# Patient Record
Sex: Male | Born: 1940 | Race: White | Hispanic: No | Smoking: Current every day smoker
Health system: Southern US, Community
[De-identification: ages and names within clinical notes are randomized; demographics above are authoritative.]

## PROBLEM LIST (undated history)

## (undated) DIAGNOSIS — F1010 Alcohol abuse, uncomplicated: Secondary | ICD-10-CM

## (undated) DIAGNOSIS — J61 Pneumoconiosis due to asbestos and other mineral fibers: Secondary | ICD-10-CM

## (undated) DIAGNOSIS — I2510 Atherosclerotic heart disease of native coronary artery without angina pectoris: Secondary | ICD-10-CM

## (undated) DIAGNOSIS — E119 Type 2 diabetes mellitus without complications: Secondary | ICD-10-CM

## (undated) DIAGNOSIS — E785 Hyperlipidemia, unspecified: Secondary | ICD-10-CM

## (undated) DIAGNOSIS — Z720 Tobacco use: Secondary | ICD-10-CM

## (undated) DIAGNOSIS — K219 Gastro-esophageal reflux disease without esophagitis: Secondary | ICD-10-CM

## (undated) DIAGNOSIS — I10 Essential (primary) hypertension: Secondary | ICD-10-CM

## (undated) DIAGNOSIS — I639 Cerebral infarction, unspecified: Secondary | ICD-10-CM

## (undated) HISTORY — PX: OTHER SURGICAL HISTORY: SHX169

## (undated) HISTORY — DX: Essential (primary) hypertension: I10

## (undated) HISTORY — DX: Atherosclerotic heart disease of native coronary artery without angina pectoris: I25.10

## (undated) HISTORY — DX: Type 2 diabetes mellitus without complications: E11.9

## (undated) HISTORY — DX: Gastro-esophageal reflux disease without esophagitis: K21.9

## (undated) HISTORY — PX: APPENDECTOMY: SHX54

## (undated) HISTORY — DX: Tobacco use: Z72.0

---

## 1999-08-07 ENCOUNTER — Encounter: Admission: RE | Admit: 1999-08-07 | Discharge: 1999-08-07 | Payer: Self-pay | Admitting: Cardiology

## 1999-08-07 ENCOUNTER — Encounter: Payer: Self-pay | Admitting: Cardiology

## 1999-09-04 ENCOUNTER — Encounter: Payer: Self-pay | Admitting: Cardiology

## 1999-09-04 ENCOUNTER — Ambulatory Visit (HOSPITAL_COMMUNITY): Admission: RE | Admit: 1999-09-04 | Discharge: 1999-09-04 | Payer: Self-pay | Admitting: *Deleted

## 2000-04-25 ENCOUNTER — Observation Stay (HOSPITAL_COMMUNITY): Admission: RE | Admit: 2000-04-25 | Discharge: 2000-04-26 | Payer: Self-pay | Admitting: Cardiology

## 2000-10-23 ENCOUNTER — Emergency Department (HOSPITAL_COMMUNITY): Admission: EM | Admit: 2000-10-23 | Discharge: 2000-10-23 | Payer: Self-pay | Admitting: Emergency Medicine

## 2000-11-28 ENCOUNTER — Emergency Department (HOSPITAL_COMMUNITY): Admission: EM | Admit: 2000-11-28 | Discharge: 2000-11-28 | Payer: Self-pay | Admitting: *Deleted

## 2001-06-13 ENCOUNTER — Encounter: Payer: Self-pay | Admitting: Cardiology

## 2001-06-13 ENCOUNTER — Ambulatory Visit (HOSPITAL_COMMUNITY): Admission: RE | Admit: 2001-06-13 | Discharge: 2001-06-13 | Payer: Self-pay | Admitting: Cardiology

## 2001-08-18 ENCOUNTER — Emergency Department (HOSPITAL_COMMUNITY): Admission: EM | Admit: 2001-08-18 | Discharge: 2001-08-18 | Payer: Self-pay | Admitting: Emergency Medicine

## 2002-05-01 ENCOUNTER — Encounter: Payer: Self-pay | Admitting: Cardiology

## 2002-05-01 ENCOUNTER — Ambulatory Visit (HOSPITAL_COMMUNITY): Admission: RE | Admit: 2002-05-01 | Discharge: 2002-05-01 | Payer: Self-pay | Admitting: Cardiology

## 2011-04-15 ENCOUNTER — Encounter (HOSPITAL_COMMUNITY): Payer: Self-pay

## 2011-04-15 ENCOUNTER — Emergency Department (HOSPITAL_COMMUNITY)
Admission: EM | Admit: 2011-04-15 | Discharge: 2011-04-15 | Disposition: A | Discharge status: Home / Self Care | Payer: Medicare PPO | Attending: Emergency Medicine | Admitting: Emergency Medicine

## 2011-04-15 ENCOUNTER — Other Ambulatory Visit: Payer: Self-pay

## 2011-04-15 ENCOUNTER — Emergency Department (HOSPITAL_COMMUNITY): Payer: Medicare PPO

## 2011-04-15 DIAGNOSIS — I252 Old myocardial infarction: Secondary | ICD-10-CM | POA: Insufficient documentation

## 2011-04-15 DIAGNOSIS — Z8673 Personal history of transient ischemic attack (TIA), and cerebral infarction without residual deficits: Secondary | ICD-10-CM | POA: Insufficient documentation

## 2011-04-15 DIAGNOSIS — Z7982 Long term (current) use of aspirin: Secondary | ICD-10-CM | POA: Insufficient documentation

## 2011-04-15 DIAGNOSIS — R404 Transient alteration of awareness: Secondary | ICD-10-CM | POA: Insufficient documentation

## 2011-04-15 DIAGNOSIS — W1809XA Striking against other object with subsequent fall, initial encounter: Secondary | ICD-10-CM | POA: Insufficient documentation

## 2011-04-15 DIAGNOSIS — Y92009 Unspecified place in unspecified non-institutional (private) residence as the place of occurrence of the external cause: Secondary | ICD-10-CM | POA: Insufficient documentation

## 2011-04-15 DIAGNOSIS — F1010 Alcohol abuse, uncomplicated: Secondary | ICD-10-CM | POA: Insufficient documentation

## 2011-04-15 DIAGNOSIS — F17200 Nicotine dependence, unspecified, uncomplicated: Secondary | ICD-10-CM | POA: Insufficient documentation

## 2011-04-15 DIAGNOSIS — S2239XA Fracture of one rib, unspecified side, initial encounter for closed fracture: Secondary | ICD-10-CM

## 2011-04-15 DIAGNOSIS — E119 Type 2 diabetes mellitus without complications: Secondary | ICD-10-CM | POA: Insufficient documentation

## 2011-04-15 DIAGNOSIS — Y998 Other external cause status: Secondary | ICD-10-CM | POA: Insufficient documentation

## 2011-04-15 DIAGNOSIS — W19XXXA Unspecified fall, initial encounter: Secondary | ICD-10-CM

## 2011-04-15 DIAGNOSIS — Z79899 Other long term (current) drug therapy: Secondary | ICD-10-CM | POA: Insufficient documentation

## 2011-04-15 DIAGNOSIS — S2249XA Multiple fractures of ribs, unspecified side, initial encounter for closed fracture: Secondary | ICD-10-CM | POA: Insufficient documentation

## 2011-04-15 DIAGNOSIS — R109 Unspecified abdominal pain: Secondary | ICD-10-CM | POA: Insufficient documentation

## 2011-04-15 HISTORY — DX: Alcohol abuse, uncomplicated: F10.10

## 2011-04-15 HISTORY — DX: Cerebral infarction, unspecified: I63.9

## 2011-04-15 LAB — CBC
MCH: 29.3 pg (ref 26.0–34.0)
MCHC: 34.3 g/dL (ref 30.0–36.0)
Platelets: 205 10*3/uL (ref 150–400)

## 2011-04-15 LAB — DIFFERENTIAL
Basophils Relative: 1 % (ref 0–1)
Eosinophils Absolute: 0.2 10*3/uL (ref 0.0–0.7)
Monocytes Relative: 6 % (ref 3–12)
Neutrophils Relative %: 58 % (ref 43–77)

## 2011-04-15 LAB — COMPREHENSIVE METABOLIC PANEL
Albumin: 3.3 g/dL — ABNORMAL LOW (ref 3.5–5.2)
Alkaline Phosphatase: 82 U/L (ref 39–117)
BUN: 10 mg/dL (ref 6–23)
Calcium: 9.4 mg/dL (ref 8.4–10.5)
Potassium: 4 mEq/L (ref 3.5–5.1)
Sodium: 135 mEq/L (ref 135–145)
Total Protein: 6.5 g/dL (ref 6.0–8.3)

## 2011-04-15 LAB — ETHANOL: Alcohol, Ethyl (B): 316 mg/dL — ABNORMAL HIGH (ref 0–11)

## 2011-04-15 MED ORDER — HYDROCODONE-ACETAMINOPHEN 5-325 MG PO TABS: 1.0000 | ORAL_TABLET | Freq: Four times a day (QID) | ORAL | Status: DC | PRN

## 2011-04-15 MED ORDER — HYDROCODONE-ACETAMINOPHEN 5-325 MG PO TABS
1.0000 | ORAL_TABLET | Freq: Once | ORAL | Status: AC
  Administered 2011-04-15: 1 via ORAL
  Filled 2011-04-15: qty 1

## 2011-04-15 MED ORDER — SODIUM CHLORIDE 0.9 % IV SOLN
Freq: Once | INTRAVENOUS | Status: AC
  Administered 2011-04-15: 1000 mL via INTRAVENOUS

## 2011-04-15 NOTE — ED Provider Notes (Signed)
History   This chart was scribed for Larry Lennert, MD by Sofie Rower. The patient was seen in room APA17/APA17 and the patient's care was started at 3:25PM.    CSN: [id number]  Arrival date & time 04/15/11  1511   First MD Initiated Contact with Patient 04/15/11 1515      Chief Complaint  Patient presents with  . Alcohol Intoxication  . Fall    (Consider location/radiation/quality/duration/timing/severity/associated sxs/prior treatment) HPI Comments: The patient appears mildly lethargic and smells of ETOH.  Patient is a 71 y.o. male presenting with fall. The history is provided by the patient and the EMS personnel. The history is limited by the condition of the patient. No language interpreter was used.  Fall The accident occurred 3 to 5 hours ago. The fall occurred in unknown circumstances. He fell from an unknown height. He landed on a hard floor. There was no blood loss. Point of impact: Right side. Pain location: LUQ. Pain scale: Unknown. The pain is severe. He was not ambulatory at the scene. There was no entrapment after the fall. There was no drug use involved in the accident. There was alcohol use involved in the accident. Associated symptoms include abdominal pain (Pain upon palpitation.). Pertinent negatives include no fever, no hematuria, no headaches and no hearing loss. The symptoms are aggravated by pressure on the injury. Treatment on scene includes a c-collar. He has tried immobilization for the symptoms. The treatment provided no relief.   Pt does not have a PCP.  Past Medical History  Diagnosis Date  . Diabetes mellitus   . Stroke   . Myocardial infarct   . Alcohol abuse     No past surgical history on file.  No family history on file.  History  Substance Use Topics  . Smoking status: Current Everyday Smoker  . Smokeless tobacco: Not on file  . Alcohol Use: Yes    10 Systems reviewed and are negative for acute change except as noted in the  HPI.   Review of Systems  Constitutional: Negative for fever and fatigue.  HENT: Negative for congestion, sinus pressure and ear discharge.   Eyes: Negative for discharge.  Respiratory: Negative for cough.   Cardiovascular: Negative for chest pain.  Gastrointestinal: Positive for abdominal pain (Pain upon palpitation.). Negative for diarrhea.  Genitourinary: Negative for frequency and hematuria.  Musculoskeletal: Negative for back pain.  Skin: Negative for rash.  Neurological: Negative for seizures and headaches.  Hematological: Negative.   Psychiatric/Behavioral: Negative for hallucinations.  All other systems reviewed and are negative.    Allergies  Review of patient's allergies indicates not on file.  Home Medications   Current Outpatient Rx  Name Route Sig Dispense Refill  . ASPIRIN EC 325 MG PO TBEC Oral Take 325 mg by mouth daily.    . IBUPROFEN 200 MG PO TABS Oral Take 200 mg by mouth every 6 (six) hours as needed. pain    . METFORMIN HCL 500 MG PO TABS Oral Take 500 mg by mouth 2 (two) times daily with a meal.    . METOPROLOL TARTRATE 100 MG PO TABS Oral Take 100 mg by mouth 2 (two) times daily.    Marland Kitchen OMEPRAZOLE 20 MG PO CPDR Oral Take 20 mg by mouth daily.    Marland Kitchen RABEPRAZOLE SODIUM 20 MG PO TBEC Oral Take 20 mg by mouth daily.      BP 157/76  Pulse 76  Temp(Src) 97.5 F (36.4 C) (Oral)  Resp 23  SpO2 95%  Physical Exam  Nursing note and vitals reviewed. Constitutional: He is oriented to person, place, and time. He appears well-developed.       Mildly lethargic.  HENT:  Head: Normocephalic and atraumatic.  Eyes: Conjunctivae and EOM are normal. No scleral icterus.  Neck: Neck supple. No thyromegaly present.  Cardiovascular: Normal rate and regular rhythm.  Exam reveals no gallop and no friction rub.   No murmur heard. Pulmonary/Chest: No stridor. He has no wheezes. He has no rales. He exhibits no tenderness.  Abdominal: He exhibits no distension. There is  tenderness (RUQ). There is no rebound.  Musculoskeletal: Normal range of motion. He exhibits no edema.  Lymphadenopathy:    He has no cervical adenopathy.  Neurological: He is oriented to person, place, and time. Coordination normal.  Skin: No rash noted. No erythema.  Psychiatric: He has a normal mood and affect. His behavior is normal.    ED Course  Procedures (including critical care time)  DIAGNOSTIC STUDIES: Oxygen Saturation is 95% on room air, adequate by my interpretation.    COORDINATION OF CARE:    Results for orders placed during the hospital encounter of 04/15/11  CBC      Component Value Range   WBC 6.7  4.0 - 10.5 (K/uL)   RBC 4.67  4.22 - 5.81 (MIL/uL)   Hemoglobin 13.7  13.0 - 17.0 (g/dL)   HCT 96.0  45.4 - 09.8 (%)   MCV 85.4  78.0 - 100.0 (fL)   MCH 29.3  26.0 - 34.0 (pg)   MCHC 34.3  30.0 - 36.0 (g/dL)   RDW 11.9  14.7 - 82.9 (%)   Platelets 205  150 - 400 (K/uL)  DIFFERENTIAL      Component Value Range   Neutrophils Relative 58  43 - 77 (%)   Neutro Abs 3.9  1.7 - 7.7 (K/uL)   Lymphocytes Relative 32  12 - 46 (%)   Lymphs Abs 2.2  0.7 - 4.0 (K/uL)   Monocytes Relative 6  3 - 12 (%)   Monocytes Absolute 0.4  0.1 - 1.0 (K/uL)   Eosinophils Relative 3  0 - 5 (%)   Eosinophils Absolute 0.2  0.0 - 0.7 (K/uL)   Basophils Relative 1  0 - 1 (%)   Basophils Absolute 0.0  0.0 - 0.1 (K/uL)  COMPREHENSIVE METABOLIC PANEL      Component Value Range   Sodium 135  135 - 145 (mEq/L)   Potassium 4.0  3.5 - 5.1 (mEq/L)   Chloride 99  96 - 112 (mEq/L)   CO2 24  19 - 32 (mEq/L)   Glucose, Bld 245 (*) 70 - 99 (mg/dL)   BUN 10  6 - 23 (mg/dL)   Creatinine, Ser 5.62  0.50 - 1.35 (mg/dL)   Calcium 9.4  8.4 - 13.0 (mg/dL)   Total Protein 6.5  6.0 - 8.3 (g/dL)   Albumin 3.3 (*) 3.5 - 5.2 (g/dL)   AST 865 (*) 0 - 37 (U/L)   ALT 82 (*) 0 - 53 (U/L)   Alkaline Phosphatase 82  39 - 117 (U/L)   Total Bilirubin 0.1 (*) 0.3 - 1.2 (mg/dL)   GFR calc non Af Amer >90  >90  (mL/min)   GFR calc Af Amer >90  >90 (mL/min)  ETHANOL      Component Value Range   Alcohol, Ethyl (B) 316 (*) 0 - 11 (mg/dL)   Ct Head Wo Contrast  04/15/2011  *RADIOLOGY REPORT*  Clinical Data: Alcohol intoxication and fall.  CT HEAD WITHOUT CONTRAST CT CERVICAL SPINE WITHOUT CONTRAST  Technique:  Multidetector CT imaging of the head and cervical spine was performed following the standard protocol without intravenous contrast.  Multiplanar CT image reconstructions of the cervical spine were also generated.  Comparison:  None  CT HEAD  Findings: There is diffuse patchy low density throughout the subcortical and periventricular white matter consistent with chronic small vessel ischemic change.  There is prominence of the sulci and ventricles consistent with brain atrophy.  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  Small lacunar infarct is noted within the right cerebellar hemisphere.  The mastoid air cells are clear.  Retention cyst versus polyp is noted within the left maxillary sinus.  The skull appears intact.  IMPRESSION:  1.  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 2.  No acute intracranial abnormalities.  CT CERVICAL SPINE  Findings: Normal alignment of the cervical spine.  The vertebral body heights are well preserved.  Disc space narrowing and ventral endplate spurring is noted at C5-6 and C6-7.  Facet joints are aligned.  The prevertebral soft tissue space appears normal.  Calcification involving the right vertebral artery is identified.  There are no fractures noted.  The lung apices appear clear.  IMPRESSION:  1.  No acute findings. 2.  Cervical spondylosis 3.  Right vertebral artery calcified atherosclerotic disease.  Original Report Authenticated By: Rosealee Albee, M.D.   Ct Cervical Spine Wo Contrast  04/15/2011  *RADIOLOGY REPORT*  Clinical Data: Alcohol intoxication and fall.  CT HEAD WITHOUT CONTRAST CT CERVICAL SPINE WITHOUT CONTRAST  Technique:  Multidetector CT  imaging of the head and cervical spine was performed following the standard protocol without intravenous contrast.  Multiplanar CT image reconstructions of the cervical spine were also generated.  Comparison:  None  CT HEAD  Findings: There is diffuse patchy low density throughout the subcortical and periventricular white matter consistent with chronic small vessel ischemic change.  There is prominence of the sulci and ventricles consistent with brain atrophy.  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  Small lacunar infarct is noted within the right cerebellar hemisphere.  The mastoid air cells are clear.  Retention cyst versus polyp is noted within the left maxillary sinus.  The skull appears intact.  IMPRESSION:  1.  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 2.  No acute intracranial abnormalities.  CT CERVICAL SPINE  Findings: Normal alignment of the cervical spine.  The vertebral body heights are well preserved.  Disc space narrowing and ventral endplate spurring is noted at C5-6 and C6-7.  Facet joints are aligned.  The prevertebral soft tissue space appears normal.  Calcification involving the right vertebral artery is identified.  There are no fractures noted.  The lung apices appear clear.  IMPRESSION:  1.  No acute findings. 2.  Cervical spondylosis 3.  Right vertebral artery calcified atherosclerotic disease.  Original Report Authenticated By: Rosealee Albee, M.D.   Ct Abdomen Pelvis W Contrast  04/15/2011  *RADIOLOGY REPORT*  Clinical Data: Fall.  Alcohol intoxication.  CT ABDOMEN AND PELVIS WITH CONTRAST  Technique:  Multidetector CT imaging of the abdomen and pelvis was performed following the standard protocol during bolus administration of intravenous contrast.  Contrast: 100 ml of Omnipaque-300  Comparison: None.  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  Dependent type changes are noted within both lung bases.  Calcified  atherosclerotic disease affects the coronary  arteries.  No focal liver abnormality.  The gallbladder is normal.  No biliary dilatation.  The pancreas appears normal.  The spleen is negative.  Right adrenal gland adenoma is present.  The left adrenal gland is negative.  Normal appearance of the right kidney.  Left kidney normal.  No enlarged upper abdominal adenopathy.  Advanced, calcified atherosclerotic disease affects the abdominal aorta and its branches.  There is no pelvic or inguinal adenopathy identified.  The urinary bladder appears normal.  The patient has a small hiatal hernia.  Small bowel loops have a normal course and caliber without obstruction.  Diffuse colonic diverticulosis noted without acute inflammation.  No free fluid or abnormal fluid collections within the abdomen or the pelvis.  Review of the visualized osseous structures is significant for lumbar degenerative disc disease. The 7th through 10th right rib fractures are noted.  IMPRESSION:  1.  No acute findings within the abdomen or pelvis. 2.  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 3.  Coronary artery calcifications consistent with atherosclerotic disease. 4.  Acute right posterior rib fractures  Original Report Authenticated By: Rosealee Albee, M.D.             MDM  etoh abuse,  Rib fracture  3:32PM- EDP at bedside discusses treatment plan. 8:37PM- EDP at bedside discusses treatment plan. Pt ambulatory without assistance. Pt is alert and oriented.    The chart was scribed for me under my direct supervision.  I personally performed the history, physical, and medical decision making and all procedures in the evaluation of this patient.Larry Lennert, MD 04/15/11 2053

## 2011-04-15 NOTE — ED Notes (Signed)
Patient fell at home +ETOH. Patient alert/oriented. Speaks excessively loud. Patient on full spinal precautions by EMS PTA. Assisted Dr Estell Harpin to remove patient from back board.

## 2011-04-15 NOTE — ED Notes (Signed)
Per EMS pt has been on 4 day drinking binge of vodka. EMS was called out for fall. Reports falling on rt side onto nightstand hitting tv off night stand. Pt on LSB and c-collar upon arrival. Smells of ETOH. Responds to verbal stimuli,.

## 2011-04-15 NOTE — ED Notes (Signed)
Patient assisted with urinal by ED tech.

## 2011-04-21 ENCOUNTER — Encounter (HOSPITAL_COMMUNITY): Payer: Self-pay | Admitting: *Deleted

## 2011-04-21 ENCOUNTER — Emergency Department (HOSPITAL_COMMUNITY)
Admission: EM | Admit: 2011-04-21 | Discharge: 2011-04-21 | Disposition: A | Discharge status: Home / Self Care | Payer: Medicare PPO | Attending: Emergency Medicine | Admitting: Emergency Medicine

## 2011-04-21 DIAGNOSIS — E119 Type 2 diabetes mellitus without complications: Secondary | ICD-10-CM | POA: Insufficient documentation

## 2011-04-21 DIAGNOSIS — Z8673 Personal history of transient ischemic attack (TIA), and cerebral infarction without residual deficits: Secondary | ICD-10-CM | POA: Insufficient documentation

## 2011-04-21 DIAGNOSIS — I252 Old myocardial infarction: Secondary | ICD-10-CM | POA: Insufficient documentation

## 2011-04-21 DIAGNOSIS — F1010 Alcohol abuse, uncomplicated: Secondary | ICD-10-CM | POA: Insufficient documentation

## 2011-04-21 NOTE — ED Notes (Signed)
EMS called to home.  Found patient alert and oriented sitting on couch. Patient requested family call 911 to go to hospital to seek help to get off Alcohol. Patient does state that he is a binge drinker.

## 2011-04-21 NOTE — ED Provider Notes (Signed)
History     CSN: [id number]  Arrival date & time 04/21/11  6213   First MD Initiated Contact with Patient 04/21/11 [phone]      Chief Complaint  Patient presents with  . Alcohol Intoxication    (Consider location/radiation/quality/duration/timing/severity/associated sxs/prior treatment) Patient is a 71 y.o. male presenting with intoxication. The history is provided by the patient and the EMS personnel.  Alcohol Intoxication Pertinent negatives include no abdominal pain, no headaches and no shortness of breath.  pt arrives via ems, daughter had called as pt had been drinking alcohol heavily for past few days. Pt states had 2-3 beers today. Pt states he does not want to get into an etoh rehab or detox program. Pt states he had not drank for 6 months, and that in past when he does drink he will binge drink for several days, then stop. Denies hx seizures, dts, or complicated etoh withdrawal symptoms. Denies any health issues except to state fell a few days ago, and has couple broken ribs. No sob. No fevers. Denies headache. No neck or back pain. No abd pain. No nvd.   Past Medical History  Diagnosis Date  . Diabetes mellitus   . Stroke   . Myocardial infarct   . Alcohol abuse     History reviewed. No pertinent past surgical history.  History reviewed. No pertinent family history.  History  Substance Use Topics  . Smoking status: Current Everyday Smoker  . Smokeless tobacco: Not on file  . Alcohol Use: Yes      Review of Systems  Constitutional: Negative for fever.  HENT: Negative for neck pain.   Eyes: Negative for redness.  Respiratory: Negative for shortness of breath.   Cardiovascular: Negative for palpitations.  Gastrointestinal: Negative for abdominal pain.  Genitourinary: Negative for flank pain.  Musculoskeletal: Negative for back pain.  Skin: Negative for rash.  Neurological: Negative for headaches.  Hematological: Does not bruise/bleed easily.    Psychiatric/Behavioral: Negative for confusion.    Allergies  Review of patient's allergies indicates not on file.  Home Medications   Current Outpatient Rx  Name Route Sig Dispense Refill  . ASPIRIN EC 325 MG PO TBEC Oral Take 325 mg by mouth daily.    Marland Kitchen HYDROCODONE-ACETAMINOPHEN 5-325 MG PO TABS Oral Take 1 tablet by mouth every 6 (six) hours as needed for pain. 20 tablet 0  . IBUPROFEN 200 MG PO TABS Oral Take 200 mg by mouth every 6 (six) hours as needed. pain    . METFORMIN HCL 500 MG PO TABS Oral Take 500 mg by mouth 2 (two) times daily with a meal.    . METOPROLOL TARTRATE 100 MG PO TABS Oral Take 100 mg by mouth 2 (two) times daily.    Marland Kitchen OMEPRAZOLE 20 MG PO CPDR Oral Take 20 mg by mouth daily.    Marland Kitchen RABEPRAZOLE SODIUM 20 MG PO TBEC Oral Take 20 mg by mouth daily.      BP 164/75  Pulse 84  Temp(Src) 98 F (36.7 C) (Oral)  Resp 20  SpO2 100%  Physical Exam  Nursing note and vitals reviewed. Constitutional: He is oriented to person, place, and time. He appears well-developed and well-nourished. No distress.  HENT:  Head: Atraumatic.  Mouth/Throat: Oropharynx is clear and moist.  Eyes: Pupils are equal, round, and reactive to light.  Neck: Neck supple. No tracheal deviation present.  Cardiovascular: Normal rate, normal heart sounds and intact distal pulses.   Pulmonary/Chest: Effort normal and breath sounds  normal. No accessory muscle usage. No respiratory distress.       Right lower chest wall tenderness  Abdominal: Soft. Bowel sounds are normal. He exhibits no distension and no mass. There is no tenderness. There is no rebound and no guarding.  Musculoskeletal: He exhibits no edema and no tenderness.  Neurological: He is alert and oriented to person, place, and time.       Motor intact bil. Steady gait.   Skin: Skin is warm and dry.  Psychiatric: He has a normal mood and affect.    ED Course  Procedures (including critical care time)    MDM  Pt refuses etoh  rehab/detox. States he hasnt drank for 6 months and when he dose he will binge drink for a few days.    Recheck w pt, again offered etoh rehab/detox. Pt declines. States he can stop drinking anytime he wants, and indicates now he has decided he wants to stop. States he has a girlfriend who is very important to him, who doesn't like him to drink, and so he is committed to stopping.  Encourage pt to use community resources provided. May return if reconsider and wants inpt detox/rehab program.      Suzi Roots, MD 04/21/11 1044

## 2011-04-21 NOTE — ED Notes (Signed)
Patient is alert and oriented x3.  He was given DC instructions and follow up visit instructions.  Patient gave verbal understanding.  He was DC ambulatory under his own power to home.  V/S stable.  He was not showing any signs of distress on DC 

## 2011-05-19 ENCOUNTER — Inpatient Hospital Stay (HOSPITAL_COMMUNITY)
Admission: EM | Admit: 2011-05-19 | Discharge: 2011-05-27 | DRG: 233 | Disposition: A | Discharge status: Home / Self Care | Payer: Medicare PPO | Attending: Cardiothoracic Surgery | Admitting: Cardiothoracic Surgery

## 2011-05-19 ENCOUNTER — Other Ambulatory Visit: Payer: Self-pay

## 2011-05-19 ENCOUNTER — Encounter (HOSPITAL_COMMUNITY): Payer: Self-pay | Admitting: Emergency Medicine

## 2011-05-19 ENCOUNTER — Emergency Department (HOSPITAL_COMMUNITY): Payer: Medicare PPO

## 2011-05-19 DIAGNOSIS — Z8673 Personal history of transient ischemic attack (TIA), and cerebral infarction without residual deficits: Secondary | ICD-10-CM

## 2011-05-19 DIAGNOSIS — Z794 Long term (current) use of insulin: Secondary | ICD-10-CM

## 2011-05-19 DIAGNOSIS — Z7982 Long term (current) use of aspirin: Secondary | ICD-10-CM

## 2011-05-19 DIAGNOSIS — Z720 Tobacco use: Secondary | ICD-10-CM

## 2011-05-19 DIAGNOSIS — E8779 Other fluid overload: Secondary | ICD-10-CM | POA: Diagnosis not present

## 2011-05-19 DIAGNOSIS — B358 Other dermatophytoses: Secondary | ICD-10-CM | POA: Diagnosis present

## 2011-05-19 DIAGNOSIS — R739 Hyperglycemia, unspecified: Secondary | ICD-10-CM

## 2011-05-19 DIAGNOSIS — I214 Non-ST elevation (NSTEMI) myocardial infarction: Principal | ICD-10-CM | POA: Diagnosis present

## 2011-05-19 DIAGNOSIS — D62 Acute posthemorrhagic anemia: Secondary | ICD-10-CM | POA: Diagnosis not present

## 2011-05-19 DIAGNOSIS — E119 Type 2 diabetes mellitus without complications: Secondary | ICD-10-CM | POA: Diagnosis present

## 2011-05-19 DIAGNOSIS — J61 Pneumoconiosis due to asbestos and other mineral fibers: Secondary | ICD-10-CM | POA: Diagnosis present

## 2011-05-19 DIAGNOSIS — M6530 Trigger finger, unspecified finger: Secondary | ICD-10-CM | POA: Diagnosis present

## 2011-05-19 DIAGNOSIS — Y849 Medical procedure, unspecified as the cause of abnormal reaction of the patient, or of later complication, without mention of misadventure at the time of the procedure: Secondary | ICD-10-CM | POA: Diagnosis present

## 2011-05-19 DIAGNOSIS — K219 Gastro-esophageal reflux disease without esophagitis: Secondary | ICD-10-CM | POA: Diagnosis present

## 2011-05-19 DIAGNOSIS — T82897A Other specified complication of cardiac prosthetic devices, implants and grafts, initial encounter: Secondary | ICD-10-CM | POA: Diagnosis present

## 2011-05-19 DIAGNOSIS — I2510 Atherosclerotic heart disease of native coronary artery without angina pectoris: Secondary | ICD-10-CM | POA: Diagnosis present

## 2011-05-19 DIAGNOSIS — F17200 Nicotine dependence, unspecified, uncomplicated: Secondary | ICD-10-CM | POA: Diagnosis present

## 2011-05-19 DIAGNOSIS — Z79899 Other long term (current) drug therapy: Secondary | ICD-10-CM

## 2011-05-19 DIAGNOSIS — I4891 Unspecified atrial fibrillation: Secondary | ICD-10-CM | POA: Diagnosis not present

## 2011-05-19 DIAGNOSIS — I252 Old myocardial infarction: Secondary | ICD-10-CM

## 2011-05-19 DIAGNOSIS — F1010 Alcohol abuse, uncomplicated: Secondary | ICD-10-CM | POA: Diagnosis present

## 2011-05-19 DIAGNOSIS — I4901 Ventricular fibrillation: Secondary | ICD-10-CM | POA: Diagnosis not present

## 2011-05-19 HISTORY — DX: Pneumoconiosis due to asbestos and other mineral fibers: J61

## 2011-05-19 LAB — CBC
HCT: 40.5 % (ref 39.0–52.0)
MCV: 84.7 fL (ref 78.0–100.0)
MCV: 85.3 fL (ref 78.0–100.0)
Platelets: 213 10*3/uL (ref 150–400)
RBC: 4.84 MIL/uL (ref 4.22–5.81)
RDW: 14 % (ref 11.5–15.5)
WBC: 7.9 10*3/uL (ref 4.0–10.5)
WBC: 8 10*3/uL (ref 4.0–10.5)

## 2011-05-19 LAB — DIFFERENTIAL
Basophils Absolute: 0.1 10*3/uL (ref 0.0–0.1)
Basophils Absolute: 0.1 10*3/uL (ref 0.0–0.1)
Eosinophils Relative: 3 % (ref 0–5)
Lymphocytes Relative: 25 % (ref 12–46)
Lymphocytes Relative: 32 % (ref 12–46)
Lymphs Abs: 2 10*3/uL (ref 0.7–4.0)
Lymphs Abs: 2.5 10*3/uL (ref 0.7–4.0)
Monocytes Absolute: 0.5 10*3/uL (ref 0.1–1.0)
Neutro Abs: 5.1 10*3/uL (ref 1.7–7.7)
Neutrophils Relative %: 64 % (ref 43–77)

## 2011-05-19 LAB — CARDIAC PANEL(CRET KIN+CKTOT+MB+TROPI)
Relative Index: 7.1 — ABNORMAL HIGH (ref 0.0–2.5)
Total CK: 243 U/L — ABNORMAL HIGH (ref 7–232)
Total CK: 266 U/L — ABNORMAL HIGH (ref 7–232)

## 2011-05-19 LAB — COMPREHENSIVE METABOLIC PANEL
CO2: 28 mEq/L (ref 19–32)
Calcium: 10.1 mg/dL (ref 8.4–10.5)
Creatinine, Ser: 0.68 mg/dL (ref 0.50–1.35)
GFR calc Af Amer: >90 mL/min (ref >90)
GFR calc non Af Amer: >90 mL/min (ref >90)
Glucose, Bld: 159 mg/dL — ABNORMAL HIGH (ref 70–99)

## 2011-05-19 LAB — HEMOGLOBIN A1C
Hgb A1c MFr Bld: 7.7 % — ABNORMAL HIGH (ref <5.7)
Mean Plasma Glucose: 174 mg/dL — ABNORMAL HIGH (ref <117)

## 2011-05-19 LAB — MRSA PCR SCREENING: MRSA by PCR: NEGATIVE

## 2011-05-19 LAB — HEPARIN LEVEL (UNFRACTIONATED): Heparin Unfractionated: 0.29 IU/mL — ABNORMAL LOW (ref 0.30–0.70)

## 2011-05-19 LAB — TSH: TSH: 1.637 u[IU]/mL (ref 0.350–4.500)

## 2011-05-19 LAB — POCT I-STAT, CHEM 8
BUN: 11 mg/dL (ref 6–23)
Calcium, Ion: 1.18 mmol/L (ref 1.12–1.32)
Creatinine, Ser: 0.8 mg/dL (ref 0.50–1.35)
TCO2: 27 mmol/L (ref 0–100)

## 2011-05-19 LAB — GLUCOSE, CAPILLARY: Glucose-Capillary: 339 mg/dL — ABNORMAL HIGH (ref 70–99)

## 2011-05-19 MED ORDER — ASPIRIN 325 MG PO TABS: 325.0000 mg | ORAL_TABLET | Freq: Every day | ORAL | Status: DC

## 2011-05-19 MED ORDER — ACETAMINOPHEN 325 MG PO TABS: 650.0000 mg | ORAL_TABLET | ORAL | Status: DC | PRN

## 2011-05-19 MED ORDER — LISINOPRIL 5 MG PO TABS
5.0000 mg | ORAL_TABLET | Freq: Every day | ORAL | Status: DC
  Administered 2011-05-20: 09:00:00 5 mg via ORAL
  Filled 2011-05-19 (×3): qty 1

## 2011-05-19 MED ORDER — METOPROLOL TARTRATE 100 MG PO TABS
100.0000 mg | ORAL_TABLET | Freq: Two times a day (BID) | ORAL | Status: DC
  Administered 2011-05-19 – 2011-05-21 (×5): 100 mg via ORAL
  Filled 2011-05-19 (×7): qty 1

## 2011-05-19 MED ORDER — SODIUM CHLORIDE 0.9 % IJ SOLN
3.0000 mL | Freq: Two times a day (BID) | INTRAMUSCULAR | Status: DC
  Administered 2011-05-19 – 2011-05-20 (×2): 3 mL via INTRAVENOUS

## 2011-05-19 MED ORDER — INSULIN ASPART 100 UNIT/ML ~~LOC~~ SOLN: 0.0000 [IU] | Freq: Every day | SUBCUTANEOUS | Status: DC

## 2011-05-19 MED ORDER — ATORVASTATIN CALCIUM 80 MG PO TABS
80.0000 mg | ORAL_TABLET | Freq: Every day | ORAL | Status: DC
  Administered 2011-05-19 – 2011-05-26 (×7): 80 mg via ORAL
  Filled 2011-05-19 (×9): qty 1

## 2011-05-19 MED ORDER — ASPIRIN 81 MG PO CHEW
324.0000 mg | CHEWABLE_TABLET | Freq: Once | ORAL | Status: AC
  Administered 2011-05-19: 324 mg via ORAL

## 2011-05-19 MED ORDER — SODIUM CHLORIDE 0.9 % IJ SOLN: 3.0000 mL | INTRAMUSCULAR | Status: DC | PRN

## 2011-05-19 MED ORDER — ASPIRIN 300 MG RE SUPP: 300.0000 mg | RECTAL | Status: AC

## 2011-05-19 MED ORDER — ONDANSETRON HCL 4 MG/2ML IJ SOLN: 4.0000 mg | Freq: Four times a day (QID) | INTRAMUSCULAR | Status: DC | PRN

## 2011-05-19 MED ORDER — HEPARIN (PORCINE) IN NACL 100-0.45 UNIT/ML-% IJ SOLN
1500.0000 [IU]/h | INTRAMUSCULAR | Status: DC
  Administered 2011-05-20: 07:00:00 1150 [IU]/h via INTRAVENOUS
  Administered 2011-05-21: 1500 [IU]/h via INTRAVENOUS
  Filled 2011-05-19 (×2): qty 250

## 2011-05-19 MED ORDER — ASPIRIN 81 MG PO CHEW
CHEWABLE_TABLET | ORAL | Status: AC
  Filled 2011-05-19: qty 4

## 2011-05-19 MED ORDER — SODIUM CHLORIDE 0.9 % IV BOLUS (SEPSIS)
1000.0000 mL | Freq: Once | INTRAVENOUS | Status: AC
  Administered 2011-05-19: 1000 mL via INTRAVENOUS

## 2011-05-19 MED ORDER — HEPARIN BOLUS VIA INFUSION
4000.0000 [IU] | Freq: Once | INTRAVENOUS | Status: AC
  Administered 2011-05-19: 4000 [IU] via INTRAVENOUS

## 2011-05-19 MED ORDER — INSULIN ASPART PROT & ASPART (70-30 MIX) 100 UNIT/ML ~~LOC~~ SUSP
30.0000 [IU] | Freq: Two times a day (BID) | SUBCUTANEOUS | Status: DC
  Administered 2011-05-19 – 2011-05-21 (×4): 30 [IU] via SUBCUTANEOUS
  Filled 2011-05-19: qty 3

## 2011-05-19 MED ORDER — NITROGLYCERIN IN D5W 200-5 MCG/ML-% IV SOLN
5.0000 ug/min | INTRAVENOUS | Status: DC
  Administered 2011-05-19: 5 ug/min via INTRAVENOUS
  Filled 2011-05-19: qty 250

## 2011-05-19 MED ORDER — PANTOPRAZOLE SODIUM 40 MG PO TBEC
40.0000 mg | DELAYED_RELEASE_TABLET | Freq: Every day | ORAL | Status: DC
  Administered 2011-05-20: 40 mg via ORAL
  Filled 2011-05-19: qty 1

## 2011-05-19 MED ORDER — ASPIRIN 81 MG PO CHEW: 324.0000 mg | CHEWABLE_TABLET | ORAL | Status: AC

## 2011-05-19 MED ORDER — INFLUENZA VIRUS VACC SPLIT PF IM SUSP
0.5000 mL | INTRAMUSCULAR | Status: AC
  Filled 2011-05-19: qty 0.5

## 2011-05-19 MED ORDER — SODIUM CHLORIDE 0.9 % IV SOLN: 250.0000 mL | INTRAVENOUS | Status: DC | PRN

## 2011-05-19 MED ORDER — ASPIRIN 325 MG PO TABS
325.0000 mg | ORAL_TABLET | Freq: Every day | ORAL | Status: DC
  Administered 2011-05-20 – 2011-05-21 (×2): 325 mg via ORAL
  Filled 2011-05-19 (×3): qty 1

## 2011-05-19 MED ORDER — HEPARIN (PORCINE) IN NACL 100-0.45 UNIT/ML-% IJ SOLN
1000.0000 [IU]/h | INTRAMUSCULAR | Status: DC
  Administered 2011-05-19: 1000 [IU]/h via INTRAVENOUS
  Filled 2011-05-19: qty 250

## 2011-05-19 MED ORDER — INSULIN ASPART 100 UNIT/ML ~~LOC~~ SOLN
0.0000 [IU] | Freq: Three times a day (TID) | SUBCUTANEOUS | Status: DC
  Administered 2011-05-19: 19:00:00 3 [IU] via SUBCUTANEOUS
  Administered 2011-05-20 (×2): 2 [IU] via SUBCUTANEOUS
  Administered 2011-05-20: 3 [IU] via SUBCUTANEOUS
  Administered 2011-05-21: 18:00:00 15 [IU] via SUBCUTANEOUS
  Filled 2011-05-19: qty 3

## 2011-05-19 MED ORDER — NITROGLYCERIN 0.4 MG SL SUBL: 0.4000 mg | SUBLINGUAL_TABLET | SUBLINGUAL | Status: DC | PRN

## 2011-05-19 NOTE — Progress Notes (Signed)
CRITICAL VALUE ALERT  Critical value received:  CKMB  Date of notification:  t  Time of notification:  n  Critical value read back:yes  Nurse who received alert:  Berle Mull RN  MD notified (1st page):  Dr. Donato Schultz  Time of first page: [phone]  MD notified (2nd page):  Time of second page:  Responding MD:Dr. Anne Fu  Time MD responded:  [phone]

## 2011-05-19 NOTE — Progress Notes (Signed)
ANTICOAGULATION CONSULT NOTE - Initial Consult  Pharmacy Consult for heparin Indication: ACS/NSTEMI  Assessment: 71 yo male with known coronary artery disease, prior stroke, diabetes, tobacco use, with multiple stents here with non-ST elevation myocardial infarction.  He was started on IV heparin in the ED (4000 unit bolus, then 1000 unit/hr rate). No bleeding issues reported.  Goal of Therapy:  Heparin level 0.3-0.7 units/ml   Plan:  -Continue heparin IV at 1000 units/hr -Check 6-hour HL -Check daily HL & CBC  Bayard Beaver. Saul Fordyce, PharmD Pager: [phone]  05/19/2011 3:42 PM    Allergies  Allergen Reactions  . Codeine Nausea And Vomiting, Swelling and Other (See Comments)    Blisters and swelling    Patient Measurements: Height: 5\' 10"  (177.8 cm) Weight: 190 lb 11.2 oz (86.5 kg) IBW/kg (Calculated) : 73  Heparin Dosing Weight: 86.5kg  Vital Signs: Temp: 97.7 F (36.5 C) (03/02 1500) Temp src: Oral (03/02 1500) BP: 143/77 mmHg (03/02 1500) Pulse Rate: 64  (03/02 1500)  Labs:  Basename 05/19/11 1452 05/19/11 1058 05/19/11 1043 05/19/11 1042  HGB 14.2 13.9 -- --  HCT 40.5 41.0 -- 41.0  PLT 211 -- -- 213  APTT -- -- -- --  LABPROT -- -- -- --  INR -- -- -- --  HEPARINUNFRC -- -- -- --  CREATININE -- 0.80 -- --  CKTOTAL -- -- -- --  CKMB -- -- -- --  TROPONINI -- -- 0.92* --   Estimated Creatinine Clearance: 91.3 ml/min (by C-G formula based on Cr of 0.8).  Medical History: Past Medical History  Diagnosis Date  . Diabetes mellitus   . Stroke   . Myocardial infarct   . Alcohol abuse     Medications:  Prescriptions prior to admission  Medication Sig Dispense Refill  . Ascorbic Acid (VITAMIN C PO) Take 1 tablet by mouth daily.      Marland Kitchen aspirin 325 MG tablet Take 325 mg by mouth daily.      . Cyanocobalamin (VITAMIN B 12 PO) Take 1 tablet by mouth daily.      . insulin NPH-insulin regular (NOVOLIN 70/30) (70-30) 100 UNIT/ML injection Inject 20 Units into the  skin 3 (three) times daily. 20 units three times daily or 30 units twice daily.  Uses 20 units in the morning, at lunch time if sugar is close to 100 will skip dose, if close to 200 will give 20 units, then 20 units at bedtime.      . metFORMIN (GLUCOPHAGE) 1000 MG tablet Take 1,000 mg by mouth 2 (two) times daily with a meal.      . metoprolol (LOPRESSOR) 100 MG tablet Take 100 mg by mouth 2 (two) times daily.      . Multiple Vitamins-Minerals (ZINC PO) Take 1 tablet by mouth daily.      . Pyridoxine HCl (VITAMIN B-6 PO) Take 1 tablet by mouth daily.      . RABEprazole (ACIPHEX) 20 MG tablet Take 20 mg by mouth 2 (two) times daily.        Scheduled:    . aspirin  324 mg Oral Once  . aspirin  324 mg Oral NOW   Or  . aspirin  300 mg Rectal NOW  . aspirin  325 mg Oral Daily  . atorvastatin  80 mg Oral q1800  . heparin  4,000 Units Intravenous Once  . insulin aspart  0-15 Units Subcutaneous TID WC  . insulin aspart  0-5 Units Subcutaneous QHS  . insulin  aspart protamine-insulin aspart  30 Units Subcutaneous BID WC  . lisinopril  5 mg Oral Daily  . metoprolol  100 mg Oral BID  . pantoprazole  40 mg Oral Q1200  . sodium chloride  1,000 mL Intravenous Once  . sodium chloride  3 mL Intravenous Q12H  . DISCONTD: aspirin  325 mg Oral Daily   Infusions:    . heparin 1,000 Units/hr (05/19/11 1500)  . nitroGLYCERIN 10 mcg/min (05/19/11 1500)

## 2011-05-19 NOTE — ED Notes (Signed)
Receiving rn unable to take report. 

## 2011-05-19 NOTE — ED Notes (Signed)
Pt reports mid sternal chest pain onset this morning with nausea. Pt also reports headache. Pt checked CBG at home 65, ate breakfast CBG increased to 265. Pt took insulin 30 units.

## 2011-05-19 NOTE — ED Provider Notes (Signed)
Medical screening examination/treatment/procedure(s) were conducted as a shared visit with non-physician practitioner(s) and myself.  I personally evaluated the patient during the encounter   Larry Cooper is a 71 y.o. male  who has had chest pain this morning since 5 AM that spontaneously resolved to 2/10. He does not have a local cardiologist. He continues to smoke. He appears comfortable in emergency department. Heart regular rate and rhythm. No murmur. Lungs clear to auscultation. EKG is normal. Troponin i-STAT is elevated. 0.46. Repeat labs ordered. Patient started on heparin, and nitroglycerin drips, and received oral aspirin. I discussed the case with the on-call cardiologist, Dr. Jens Som; will evaluate the patient in emergency department.  CRITICAL CARE Performed by: Mancel Bale L   Total critical care time: 35 min.  Critical care time was exclusive of separately billable procedures and treating other patients.  Critical care was necessary to treat or prevent imminent or life-threatening deterioration.  Critical care was time spent personally by me on the following activities: development of treatment plan with patient and/or surrogate as well as nursing, discussions with consultants, evaluation of patient's response to treatment, examination of patient, obtaining history from patient or surrogate, ordering and performing treatments and interventions, ordering and review of laboratory studies, ordering and review of radiographic studies, pulse oximetry and re-evaluation of patient's condition.  Laboratory troponin I returned high, 0.92. His pain is still 2/10 after 30 minutes of nitroglycerin. Will titrate up. The blood pressure is 151/90. Repeat ECG unchanged.- 12:04    Flint Melter, MD 05/19/11 1212

## 2011-05-19 NOTE — H&P (Signed)
Admit date: 05/19/2011 Primary Physician : Unknown  Primary Cardiologist  none  CC: Chest pain  HPI: 71 year old male with known coronary artery disease status post "multiple stents "the last one approximately 3 years ago in [facility], approximately 10 years ago here at [facility] hospital with diabetes, prior stroke, details unknown, alcohol use here with chest pain. He states that he was drinking alcohol last night, states that he only drinks once or twice a year, and slept well, however earlier this morning while cleaning his toes he developed left-sided substernal chest discomfort that was moderate in severity and associated with mild shortness of breath with no radiation. He felt a similar pain 3 years ago prior to his last stent placement. He still continues to smoke. His father had a myocardial infarction at age 35. Once he was here in the emergency room, his chest pain had subsided to almost 0. He was placed on heparin IV as well as nitroglycerin. He has a dermatologic condition/rash which is apparently fungal on his right arm. He also has difficulty with his left hand fingers, trigger fingers and these have been locked in place for several days he states.  His EKG shows sinus rhythm without any ST segment changes. History upon and however was positive at 0.92. I do not have a CK or MB at this point. His creatinine is 0.8. Blood glucose is ranging between 328 and 245. His white count is normal and his hemoglobin is 13.9. On 1/27, he was in the emergency department getting a CT scan of his abdomen, cervical spine, head and had an alcohol level of 316.   PMH:   Past Medical History  Diagnosis Date  . Diabetes mellitus   . Stroke   . Myocardial infarct   . Alcohol abuse     PSH:   Past Surgical History  Procedure Date  . Heart stents   . Appendectomy    Allergies:  Codeine Prior to Admit Meds:  Aspirin 325,  70/30 insulins-20 units 3 times a day metformin 1000 mg twice a  day, metoprolol 100 mg twice a day,  AcipHex 20 mg 2 times a day,  vitamin C, vitamin D 6, vitamin B12.  Fam HX:   Father with myocardial infarction at age 7. Social HX:    History   Social History  . Marital Status: Divorced    Spouse Name: N/A    Number of Children: N/A  . Years of Education: N/A   Occupational History  . Not on file.   Social History Main Topics  . Smoking status: Current Everyday Smoker  . Smokeless tobacco: Not on file  . Alcohol Use: Yes  . Drug Use: No  . Sexually Active:    Other Topics Concern  . Not on file   Social History Narrative  . No narrative on file     ROS:  All 11 ROS were addressed and are negative except what is stated in the HPI  Physical Exam: Blood pressure 178/71, pulse 71, temperature 98 F (36.7 C), temperature source Oral, resp. rate 22, height 5\' 10"  (1.778 m), weight 88.451 kg (195 lb), SpO2 100.00%.    General: Well developed, well nourished, in no acute distress Head: Eyes PERRLA, No xanthomas.   Normal cephalic and atramatic  Lungs:  Clear bilaterally to auscultation and percussion. Normal respiratory effort. No wheezes, no rales. Heart:  HRRR S1 S2 Pulses are 2+ & equal. No murmur.  No carotid bruit. No JVD.  No abdominal bruits. No femoral bruits. Abdomen: Bowel sounds are positive, abdomen soft and non-tender without masses or                  Hernia's noted. No hepatosplenomegaly. Msk:  Back normal, normal gait. Normal strength and tone for age. Extremities:  No clubbing, cyanosis or edema.  DP +1, rash on right forearm. Left hand with third and fourth digit flexed secondary to trigger finger. Neuro: Alert and oriented X 3, non-focal, MAE x 4 GU: Deferred Rectal: Deferred Psych:  Good affect, responds appropriately    Labs:   Lab Results  Component Value Date   WBC 7.9 05/19/2011   HGB 13.9 05/19/2011   HCT 41.0 05/19/2011   MCV 84.7 05/19/2011   PLT 213 05/19/2011    Lab 05/19/11 1058  NA 138  K  4.1  CL 102  CO2 --  BUN 11  CREATININE 0.80  CALCIUM --  PROT --  BILITOT --  ALKPHOS --  ALT --  AST --  GLUCOSE 328*   No results found for this basename: PTT   No results found for this basename: INR, PROTIME   Lab Results  Component Value Date   TROPONINI 0.92* 05/19/2011        Radiology:  Dg Chest 2 View  05/19/2011  *RADIOLOGY REPORT*  Clinical Data: Chest pain and shortness of breath since this morning.  History of smoking, hypertension, diabetes, emphysema, and asbestosis.  CHEST - 2 VIEW  Comparison: None.  Findings: Heart size is normal. There are numerous opacities overlying the lungs bilaterally.  At the lung base, there is calcified pleural plaque.  This may account for the lung densities but pulmonary masses are not excluded.  No definite consolidations. No pleural effusions.  Degenerative changes are seen in the spine. There is no evidence for pulmonary edema.  IMPRESSION:  1.  Normal heart size. 2.  Multiple lung opacities.  I would favor that the cause of these is multiple calcified pleural plaques.  However, because pulmonary masses are not excluded, CT of the chest with contrast is recommended.  Original Report Authenticated By: Patterson Hammersmith, M.D.   Personally viewed.   EKG:  Normal sinus rhythm without any ST segment changes. Personally viewed.  ASSESSMENT/PLAN:   71 year old male with known coronary artery disease, prior stroke, diabetes, tobacco use, with multiple stents here with non-ST elevation myocardial infarction.  Non-ST elevation myocardial infarction-continue with aspirin, heparin IV, nitroglycerin IV, beta blocker metoprolol 100 mg twice a day. I will start statin.  I will hold off on Plavix given the possibility of him needing bypass. Of course if chest pain worsens, I will initiate. I will check an echocardiogram to assess LV function. If diminished, will add ACE inhibitor. However, ACE inhibitor would be advisable given his diabetes. I will go  ahead and start low-dose lisinopril at 5 mg a day. My plan will be to take him to the cardiac catheterization lab on Monday. Perhaps first case at 7:30 AM. He will be n.p.o. past midnight on Sunday. If his symptoms worsen or become more worrisome, we will perform urgent cardiac catheterization. We will hold metformin.  Diabetes mellitus-blood sugars are elevated currently. I will continue him on his 70/30 30 units 2 times a day. I will also place him on insolent sliding scale. Hold metformin given contrast load.  GERD-continue with AcipHex.  Critical care time-40 minutes-spent interviewing the patient, review of medical records,  labs, EKG. Current life-threatening illness non-ST elevation myocardial infarction.  Trigger finger - eval by ortho.  Donato Schultz, MD  05/19/2011  12:18 PM

## 2011-05-19 NOTE — ED Notes (Signed)
Pt has a critical lab value troponin of 0.92  Given to Dr. Effie Shy.

## 2011-05-19 NOTE — Progress Notes (Signed)
ANTICOAGULATION CONSULT NOTE - FOLLOW UP  Pharmacy Consult: Heparin Indication: ACS/NSTEMI  HL = 0.29 (goal 0.3 - 0.7 units/mL) Heparin weight = 87kg  Assessment: 70 YOM on heparin gtt for NSTEMI.  Heparin level slightly below goal on 1000 units/hr; heparin infusing without complications per RN.  No bleeding documented.  Noted elevated troponins.  Plan: - Increase heparin gtt to 1150 units/hr - F/U AM labs   Phillips Climes, PharmD  05/19/2011 6:40 PM

## 2011-05-19 NOTE — ED Provider Notes (Signed)
Medical screening examination/treatment/procedure(s) were conducted as a shared visit with non-physician practitioner(s) and myself.  I personally evaluated the patient during the encounter  Pt seen, evaluated and treated by me.  DX: Non-STEMI Treated with IV Heparin and NTG in ED.  Flint Melter, MD 05/19/11 [phone]

## 2011-05-19 NOTE — ED Notes (Signed)
CBG 339 Rn notified [place]

## 2011-05-19 NOTE — ED Notes (Signed)
Pt also reports his (L) ring finger has been "drawed" in x1 wk and his (L) middle finger is starting to do the same

## 2011-05-19 NOTE — ED Notes (Signed)
Pt c/o mid-sternum chest pain and nausea starting 0500 this am. Pt denies sob, abd/back/arm/shoulder pain, or diaphoretic. Pt reports working out yesterday and swimming

## 2011-05-19 NOTE — ED Provider Notes (Signed)
History     CSN: [id number]  Arrival date & time 05/19/11  1001   First MD Initiated Contact with Patient 05/19/11 1013      Chief Complaint  Patient presents with  . Chest Pain    Onset this morning    (Consider location/radiation/quality/duration/timing/severity/associated sxs/prior treatment) HPI  71 year old male with history of MI, diabetes, and stroke presents with midsternal chest pain that started this morning. Patient described pain as a sharp sensation, lasting for seconds and is reproducible with arm movement. He also noticed some shortness of breath associated with it.  He denies nausea, diaphoresis, or radiating pain. Patient denies productive cough, fever, abdominal pain, or back pain. Patient first noticed the symptoms when he was trying to clean his stove this AM.  Patient also recall starting new exercise regimen and swimming several days ago.  Patient is a smoker. States he has a history of MI, and most recent was in 2009. He has multiple cardiac stenting in the past.  Patient also has a history of diabetes. He reports having headache this morning and when he checked his blood sugar was 65. He proceeds to eat his breakfast and recheck his blood sugar again and it was 265. He then proceeded to take 30 units of insulin. His most recent CBG is 339 on arrival.  Past Medical History  Diagnosis Date  . Diabetes mellitus   . Stroke   . Myocardial infarct   . Alcohol abuse     Past Surgical History  Procedure Date  . Heart stents   . Appendectomy     History reviewed. No pertinent family history.  History  Substance Use Topics  . Smoking status: Current Everyday Smoker  . Smokeless tobacco: Not on file  . Alcohol Use: Yes      Review of Systems  All other systems reviewed and are negative.    Allergies  Codeine  Home Medications   Current Outpatient Rx  Name Route Sig Dispense Refill  . ASPIRIN EC 325 MG PO TBEC Oral Take 325 mg by mouth daily.      . IBUPROFEN 200 MG PO TABS Oral Take 200 mg by mouth every 6 (six) hours as needed. pain    . METFORMIN HCL 500 MG PO TABS Oral Take 500 mg by mouth 2 (two) times daily with a meal.    . METOPROLOL TARTRATE 100 MG PO TABS Oral Take 100 mg by mouth 2 (two) times daily.    Marland Kitchen RABEPRAZOLE SODIUM 20 MG PO TBEC Oral Take 20 mg by mouth daily.      BP 182/84  Pulse 76  Temp(Src) 98 F (36.7 C) (Oral)  Resp 22  SpO2 98%  Physical Exam  Nursing note and vitals reviewed. Constitutional: He appears well-developed and well-nourished. No distress.       Awake, alert, nontoxic appearance  HENT:  Head: Atraumatic.  Eyes: Conjunctivae are normal. Right eye exhibits no discharge. Left eye exhibits no discharge.  Neck: Normal range of motion. Neck supple.  Cardiovascular: Normal rate and regular rhythm.   Pulmonary/Chest: Effort normal. No respiratory distress. He exhibits tenderness.       Tenderness to anterior chest muscle with bilateral arm movement. Mildly tender on palpation without overlying skin changes or crepitation.  Abdominal: Soft. There is no tenderness. There is no rebound.  Musculoskeletal: He exhibits no tenderness.       ROM appears intact, no obvious focal weakness  Neurological: He is alert.  Skin: Skin is  warm and dry. No rash noted.  Psychiatric: He has a normal mood and affect.    ED Course  Procedures (including critical care time)  Labs Reviewed - No data to display No results found.   No diagnosis found.   Date: 05/19/2011  Rate: 75  Rhythm: normal sinus rhythm  QRS Axis: normal  Intervals: normal  ST/T Wave abnormalities: normal  Conduction Disutrbances:none  Narrative Interpretation:   Old EKG Reviewed: unchanged  Results for orders placed during the hospital encounter of 05/19/11  GLUCOSE, CAPILLARY      Component Value Range   Glucose-Capillary 339 (*) 70 - 99 (mg/dL)  CBC      Component Value Range   WBC 7.9  4.0 - 10.5 (K/uL)   RBC 4.84  4.22  - 5.81 (MIL/uL)   Hemoglobin 14.3  13.0 - 17.0 (g/dL)   HCT 40.9  81.1 - 91.4 (%)   MCV 84.7  78.0 - 100.0 (fL)   MCH 29.5  26.0 - 34.0 (pg)   MCHC 34.9  30.0 - 36.0 (g/dL)   RDW 78.2  95.6 - 21.3 (%)   Platelets 213  150 - 400 (K/uL)  DIFFERENTIAL      Component Value Range   Neutrophils Relative 64  43 - 77 (%)   Neutro Abs 5.1  1.7 - 7.7 (K/uL)   Lymphocytes Relative 25  12 - 46 (%)   Lymphs Abs 2.0  0.7 - 4.0 (K/uL)   Monocytes Relative 8  3 - 12 (%)   Monocytes Absolute 0.6  0.1 - 1.0 (K/uL)   Eosinophils Relative 2  0 - 5 (%)   Eosinophils Absolute 0.2  0.0 - 0.7 (K/uL)   Basophils Relative 1  0 - 1 (%)   Basophils Absolute 0.1  0.0 - 0.1 (K/uL)  POCT I-STAT, CHEM 8      Component Value Range   Sodium 138  135 - 145 (mEq/L)   Potassium 4.1  3.5 - 5.1 (mEq/L)   Chloride 102  96 - 112 (mEq/L)   BUN 11  6 - 23 (mg/dL)   Creatinine, Ser 0.86  0.50 - 1.35 (mg/dL)   Glucose, Bld 578 (*) 70 - 99 (mg/dL)   Calcium, Ion 4.69  6.29 - 1.32 (mmol/L)   TCO2 27  0 - 100 (mmol/L)   Hemoglobin 13.9  13.0 - 17.0 (g/dL)   HCT 52.8  41.3 - 24.4 (%)  POCT I-STAT TROPONIN I      Component Value Range   Troponin i, poc 0.46 (*) 0.00 - 0.08 (ng/mL)   Comment NOTIFIED PHYSICIAN     Comment 3           TROPONIN I      Component Value Range   Troponin I 0.92 (*) <0.30 (ng/mL)   Dg Chest 2 View  05/19/2011  *RADIOLOGY REPORT*  Clinical Data: Chest pain and shortness of breath since this morning.  History of smoking, hypertension, diabetes, emphysema, and asbestosis.  CHEST - 2 VIEW  Comparison: None.  Findings: Heart size is normal. There are numerous opacities overlying the lungs bilaterally.  At the lung base, there is calcified pleural plaque.  This may account for the lung densities but pulmonary masses are not excluded.  No definite consolidations. No pleural effusions.  Degenerative changes are seen in the spine. There is no evidence for pulmonary edema.  IMPRESSION:  1.  Normal heart size.  2.  Multiple lung opacities.  I would favor that the  cause of these is multiple calcified pleural plaques.  However, because pulmonary masses are not excluded, CT of the chest with contrast is recommended.  Original Report Authenticated By: Patterson Hammersmith, M.D.      MDM  Chest pain in that patient has significant cardiac history including MI. His pain is reproducible with arm range of motion and with palpation which makes me thinks is likely musculoskeletal from his recent exercise. However, with a significant cardiac history, a cardiac workup was initiated.  Hx of diabetes.  Initially experiencing some headache and check his CBG, which shows 65.  He ate breakfast and CBG subsequently increased to 265.  Pt then took 30 units of insulin.  Currently CBG is 339.  Therefore, IV fluid given. I would check for anion gap. I will continue to monitor.  11:25 AM Pt has elevated troponin of 0.46.  Nitro drip, heparin bolus, and heparin drip initiated.  My attending is aware. Will continue to monitor.  Repeat ECG is normal and unchanged.  12:48 PM My attending has consulted with cardiologist, who has seen pt in ED.  Patient's pain remained a 2/10 after administration some heparin and nitroglycerin. The repeat troponin is elevated at 0.92. Repeat EKG is unremarkable.  Pt will be admitted for NSTEMI and hyperglycemia      Fayrene Helper, PA-C 05/19/11 1252

## 2011-05-19 NOTE — ED Notes (Signed)
Previous EKGs handed to Justin, EMT 

## 2011-05-19 NOTE — ED Notes (Signed)
MD at bedside. Dr. Anne Fu

## 2011-05-19 NOTE — ED Notes (Signed)
Pt transported by [employer]

## 2011-05-19 NOTE — ED Notes (Signed)
Critical lab result shown to Fayrene Helper, [place]

## 2011-05-20 LAB — BASIC METABOLIC PANEL
Calcium: 10.2 mg/dL (ref 8.4–10.5)
GFR calc Af Amer: >90 mL/min (ref >90)
GFR calc non Af Amer: >90 mL/min (ref >90)
Glucose, Bld: 55 mg/dL — ABNORMAL LOW (ref 70–99)
Potassium: 3.8 mEq/L (ref 3.5–5.1)
Sodium: 140 mEq/L (ref 135–145)

## 2011-05-20 LAB — CBC
Hemoglobin: 13.7 g/dL (ref 13.0–17.0)
MCH: 29.6 pg (ref 26.0–34.0)
MCHC: 34.7 g/dL (ref 30.0–36.0)
Platelets: 235 10*3/uL (ref 150–400)
RDW: 14.2 % (ref 11.5–15.5)

## 2011-05-20 LAB — HEPARIN LEVEL (UNFRACTIONATED): Heparin Unfractionated: 0.28 IU/mL — ABNORMAL LOW (ref 0.30–0.70)

## 2011-05-20 LAB — GLUCOSE, CAPILLARY: Glucose-Capillary: 149 mg/dL — ABNORMAL HIGH (ref 70–99)

## 2011-05-20 LAB — LIPID PANEL
HDL: 53 mg/dL (ref >39)
LDL Cholesterol: 61 mg/dL (ref 0–99)
Total CHOL/HDL Ratio: 2.3 RATIO
Triglycerides: 50 mg/dL (ref <150)

## 2011-05-20 LAB — CARDIAC PANEL(CRET KIN+CKTOT+MB+TROPI)
CK, MB: 14.6 ng/mL (ref 0.3–4.0)
Relative Index: 6.5 — ABNORMAL HIGH (ref 0.0–2.5)
Total CK: 224 U/L (ref 7–232)

## 2011-05-20 LAB — HEMOGLOBIN A1C: Mean Plasma Glucose: 183 mg/dL — ABNORMAL HIGH (ref <117)

## 2011-05-20 MED ORDER — SODIUM CHLORIDE 0.9 % IJ SOLN: 3.0000 mL | INTRAMUSCULAR | Status: DC | PRN

## 2011-05-20 MED ORDER — SODIUM CHLORIDE 0.9 % IV SOLN: 250.0000 mL | INTRAVENOUS | Status: DC | PRN

## 2011-05-20 MED ORDER — SODIUM CHLORIDE 0.9 % IV SOLN
1.0000 mL/kg/h | INTRAVENOUS | Status: DC
  Administered 2011-05-21: 04:00:00 1 mL/kg/h via INTRAVENOUS

## 2011-05-20 MED ORDER — ALUM & MAG HYDROXIDE-SIMETH 200-200-20 MG/5ML PO SUSP
30.0000 mL | ORAL | Status: DC | PRN
  Administered 2011-05-20 (×2): 30 mL via ORAL
  Filled 2011-05-20: qty 30

## 2011-05-20 MED ORDER — SODIUM CHLORIDE 0.9 % IJ SOLN: 3.0000 mL | Freq: Two times a day (BID) | INTRAMUSCULAR | Status: DC

## 2011-05-20 MED ORDER — DIAZEPAM 5 MG PO TABS
5.0000 mg | ORAL_TABLET | ORAL | Status: AC
  Administered 2011-05-21: 5 mg via ORAL
  Filled 2011-05-20: qty 1

## 2011-05-20 MED ORDER — ASPIRIN 81 MG PO CHEW: 324.0000 mg | CHEWABLE_TABLET | ORAL | Status: DC

## 2011-05-20 MED ORDER — DIAZEPAM 5 MG PO TABS: 5.0000 mg | ORAL_TABLET | ORAL | Status: DC

## 2011-05-20 MED ORDER — ASPIRIN 81 MG PO CHEW
324.0000 mg | CHEWABLE_TABLET | ORAL | Status: AC
  Administered 2011-05-21: 324 mg via ORAL
  Filled 2011-05-20: qty 4

## 2011-05-20 MED ORDER — SODIUM CHLORIDE 0.9 % IV SOLN: 1.0000 mL/kg/h | INTRAVENOUS | Status: DC

## 2011-05-20 NOTE — Progress Notes (Signed)
ANTICOAGULATION CONSULT NOTE - Follow-Up Consult  Pharmacy Consult for heparin Indication: ACS/NSTEMI  Assessment: 71 yo male with known coronary artery disease, prior stroke, diabetes, tobacco use, with multiple stents here with non-ST elevation myocardial infarction, and started on IV heparin.  Heparin level remains subtherapeutic at 0.28. No bleeding issues reported.  Per cardiology note, patient likely for cath in AM.  Pharmacy System-Based Medication Review: Cardiovascular: NSTEMI - high CV risk patient, on ASA, statin, BB, ACEi, no plavix d/t possibility of CABG, hypertensive, HR 60-85, no chest pain Endocrinology: DMII - CBGs 122-189 , on home 70/30 + SSI, metformin held Fluids/Electrolytes/Nutrition: lytes ok and stable Nephrology: SCr 0.73 stable, CrCl~52mL/min Hematology/Oncology: CBC ok and stable Best Practices: heparin, home meds addressed, PPI, ACS meds addressed  Goal of Therapy:  Heparin level 0.3-0.7 units/ml Heparin Dosing Weight: 86.5kg  Plan:  -Increase heparin IV to 1500 units/hr (=15 mL/hr) -Check daily HL & CBC  Estella Husk, Pharm.D., BCPS Clinical Pharmacist  Pager [phone] 05/20/2011, 9:13 PM     Allergies  Allergen Reactions  . Codeine Nausea And Vomiting, Swelling and Other (See Comments)    Blisters and swelling    Patient Measurements: Height: 5\' 10"  (177.8 cm) Weight: 190 lb 4.1 oz (86.3 kg) IBW/kg (Calculated) : 73    Vital Signs: Temp: 98.5 F (36.9 C) (03/03 1955) Temp src: Oral (03/03 1955) BP: 149/67 mmHg (03/03 1955) Pulse Rate: 59  (03/03 1955)  Labs:  Basename 05/20/11 2004 05/20/11 1245 05/20/11 0252 05/19/11 2036 05/19/11 1452 05/19/11 1451 05/19/11 1058 05/19/11 1042  HGB -- -- 13.7 -- 14.2 -- -- --  HCT -- -- 39.5 -- 40.5 -- 41.0 --  PLT -- -- 235 -- 211 -- -- 213  APTT -- -- -- -- 76* -- -- --  LABPROT -- -- -- -- 14.2 -- -- --  INR -- -- -- -- 1.08 -- -- --  HEPARINUNFRC 0.28* 0.24* 0.33 -- -- -- -- --    CREATININE -- -- 0.73 -- 0.68 -- 0.80 --  CKTOTAL -- -- 224 266* -- 243* -- --  CKMB -- -- 14.6* 19.0* -- 17.5* -- --  TROPONINI -- -- 4.51* 4.71* -- 2.75* -- --   Estimated Creatinine Clearance: 91.3 ml/min (by C-G formula based on Cr of 0.73).  Medical History: Past Medical History  Diagnosis Date  . Diabetes mellitus   . Stroke   . Myocardial infarct   . Alcohol abuse   . Coronary artery disease     Medications:  Prescriptions prior to admission  Medication Sig Dispense Refill  . Ascorbic Acid (VITAMIN C PO) Take 1 tablet by mouth daily.      Marland Kitchen aspirin 325 MG tablet Take 325 mg by mouth daily.      . Cyanocobalamin (VITAMIN B 12 PO) Take 1 tablet by mouth daily.      . insulin NPH-insulin regular (NOVOLIN 70/30) (70-30) 100 UNIT/ML injection Inject 20 Units into the skin 3 (three) times daily. 20 units three times daily or 30 units twice daily.  Uses 20 units in the morning, at lunch time if sugar is close to 100 will skip dose, if close to 200 will give 20 units, then 20 units at bedtime.      . metFORMIN (GLUCOPHAGE) 1000 MG tablet Take 1,000 mg by mouth 2 (two) times daily with a meal.      . metoprolol (LOPRESSOR) 100 MG tablet Take 100 mg by mouth 2 (two) times daily.      Marland Kitchen  Multiple Vitamins-Minerals (ZINC PO) Take 1 tablet by mouth daily.      . Pyridoxine HCl (VITAMIN B-6 PO) Take 1 tablet by mouth daily.      . RABEprazole (ACIPHEX) 20 MG tablet Take 20 mg by mouth 2 (two) times daily.        Scheduled:     . aspirin  324 mg Oral NOW   Or  . aspirin  300 mg Rectal NOW  . aspirin  324 mg Oral Pre-Cath  . aspirin  325 mg Oral Daily  . atorvastatin  80 mg Oral q1800  . diazepam  5 mg Oral On Call  . influenza  inactive virus vaccine  0.5 mL Intramuscular Tomorrow-1000  . insulin aspart  0-15 Units Subcutaneous TID WC  . insulin aspart  0-5 Units Subcutaneous QHS  . insulin aspart protamine-insulin aspart  30 Units Subcutaneous BID WC  . lisinopril  5 mg Oral  Daily  . metoprolol  100 mg Oral BID  . pantoprazole  40 mg Oral Q1200  . sodium chloride  3 mL Intravenous Q12H  . sodium chloride  3 mL Intravenous Q12H  . sodium chloride  3 mL Intravenous Q12H  . DISCONTD: aspirin  324 mg Oral Pre-Cath  . DISCONTD: diazepam  5 mg Oral On Call   Infusions:     . sodium chloride    . heparin 1,150 Units/hr (05/20/11 0702)  . nitroGLYCERIN 10 mcg/min (05/19/11 1900)  . DISCONTD: sodium chloride

## 2011-05-20 NOTE — Progress Notes (Signed)
Subjective:  No chest pain, no SOB. Sitting in bed.   Objective:  Vital Signs in the last 24 hours: Temp:  [97.7 F (36.5 C)-98.8 F (37.1 C)] 98.1 F (36.7 C) (03/03 0800) Pulse Rate:  [59-84] 73  (03/03 0800) Resp:  [13-21] 18  (03/03 0800) BP: (130-173)/(63-88) 144/69 mmHg (03/03 0800) SpO2:  [96 %-100 %] 98 % (03/03 0800) Weight:  [86.3 kg (190 lb 4.1 oz)-86.5 kg (190 lb 11.2 oz)] 86.3 kg (190 lb 4.1 oz) (03/03 0500)  Intake/Output from previous day: 03/02 0701 - 03/03 0700 In: 2588.5 [P.O.:1080; I.V.:1508.5] Out: 1045 [Urine:1045]   Physical Exam: General: Well developed, well nourished, in no acute distress. Head:  Normocephalic and atraumatic. Lungs: Clear to auscultation and percussion. Heart: Normal S1 and S2.  No murmur, rubs or gallops.  Pulses: Pulses normal in all 4 extremities. Abdomen: soft, non-tender, positive bowel sounds. Extremities: No clubbing or cyanosis. No edema. Mild rash right arm 2+ radial pulse. Neurologic: Alert and oriented x 3.    Lab Results:  Basename 05/20/11 0252 05/19/11 1452  WBC 9.7 8.0  HGB 13.7 14.2  PLT 235 211    Basename 05/20/11 0252 05/19/11 1452  NA 140 138  K 3.8 3.8  CL 106 102  CO2 25 28  GLUCOSE 55* 159*  BUN 11 10  CREATININE 0.73 0.68    Basename 05/20/11 0252 05/19/11 2036  TROPONINI 4.51* 4.71*   Hepatic Function Panel  Basename 05/19/11 1452  PROT 6.9  ALBUMIN 3.7  AST 32  ALT 25  ALKPHOS 116  BILITOT 0.4  BILIDIR --  IBILI --    Basename 05/20/11 0252  CHOL 124   No results found for this basename: PROTIME in the last 72 hours  Imaging: Dg Chest 2 View  05/19/2011  *RADIOLOGY REPORT*  Clinical Data: Chest pain and shortness of breath since this morning.  History of smoking, hypertension, diabetes, emphysema, and asbestosis.  CHEST - 2 VIEW  Comparison: None.  Findings: Heart size is normal. There are numerous opacities overlying the lungs bilaterally.  At the lung base, there is calcified  pleural plaque.  This may account for the lung densities but pulmonary masses are not excluded.  No definite consolidations. No pleural effusions.  Degenerative changes are seen in the spine. There is no evidence for pulmonary edema.  IMPRESSION:  1.  Normal heart size. 2.  Multiple lung opacities.  I would favor that the cause of these is multiple calcified pleural plaques.  However, because pulmonary masses are not excluded, CT of the chest with contrast is recommended.  Original Report Authenticated By: Patterson Hammersmith, M.D.   Personally viewed.   Telemetry: No adverse arrhythmias Personally viewed.   Cardiac Studies:  ECHO 05/20/11 - normal EF, MAC, mild MR/TR  Assessment/Plan:   NSTEMI  - Heparin IV, ASA, Bb, ACE-I, Statin. No plavix in case of CABG.   - Cath in am, hopefully 730am because of availability of Dr. Eldridge Dace for interventional backup at that time.   - Radial approach seems reasonable.  -Trop 4.5, MB19  I will have him follow up with PCP to follow finding on CXR.  Tobacco cessation  DM - A1c 8.0    Denessa Cavan 05/20/2011, 12:07 PM

## 2011-05-20 NOTE — Progress Notes (Signed)
ANTICOAGULATION CONSULT NOTE - Follow Up Consult  Pharmacy Consult for heparin Indication: chest pain/ACS  Labs:  Basename 05/20/11 0252 05/19/11 2036 05/19/11 1759 05/19/11 1452 05/19/11 1451 05/19/11 1058 05/19/11 1043 05/19/11 1042  HGB 13.7 -- -- 14.2 -- -- -- --  HCT 39.5 -- -- 40.5 -- 41.0 -- --  PLT 235 -- -- 211 -- -- -- 213  APTT -- -- -- 76* -- -- -- --  LABPROT -- -- -- 14.2 -- -- -- --  INR -- -- -- 1.08 -- -- -- --  HEPARINUNFRC 0.33 -- 0.29* -- -- -- -- --  CREATININE -- -- -- 0.68 -- 0.80 -- --  CKTOTAL -- 266* -- -- 243* -- -- --  CKMB -- 19.0* -- -- 17.5* -- -- --  TROPONINI -- 4.71* -- -- 2.75* -- 0.92* --   Assessment/Plan: 71yo male now therapeutic on heparin after rate increase though level hasn't increased as much as expected.  Will continue heparin at current rate and confirm stable with additional level.  Colleen Can PharmD BCPS 05/20/2011,4:00 AM

## 2011-05-20 NOTE — Progress Notes (Signed)
ANTICOAGULATION CONSULT NOTE - Initial Consult  Pharmacy Consult for heparin Indication: ACS/NSTEMI  Assessment: 71 yo male with known coronary artery disease, prior stroke, diabetes, tobacco use, with multiple stents here with non-ST elevation myocardial infarction, and started on IV heparin.  Heparin level today is subtherapeutic at 0.24. No bleeding issues reported.  Per cardiology note, patient likely for cath in AM.  Pharmacy System-Based Medication Review: Cardiovascular: NSTEMI - high CV risk patient, on ASA, statin, BB, ACEi, no plavix d/t possibility of CABG, hypertensive, HR 60-85, no chest pain Endocrinology: DMII - CBGs 122-189 , on home 70/30 + SSI, metformin held Fluids/Electrolytes/Nutrition: lytes ok and stable Nephrology: SCr 0.73 stable, CrCl~33mL/min Hematology/Oncology: CBC ok and stable Best Practices: heparin, home meds addressed, PPI, ACS meds addressed  Goal of Therapy:  Heparin level 0.3-0.7 units/ml Heparin Dosing Weight: 86.5kg  Plan:  -Increase heparin IV to 1300 units/hr (=13 mL/hr) -Check 6-hour HL -Check daily HL & CBC  Bayard Beaver. Saul Fordyce, PharmD Pager: [phone]  05/20/2011 1:45 PM    Allergies  Allergen Reactions  . Codeine Nausea And Vomiting, Swelling and Other (See Comments)    Blisters and swelling    Patient Measurements: Height: 5\' 10"  (177.8 cm) Weight: 190 lb 4.1 oz (86.3 kg) IBW/kg (Calculated) : 73    Vital Signs: Temp: 98.1 F (36.7 C) (03/03 1200) Temp src: Oral (03/03 1200) BP: 159/72 mmHg (03/03 1200) Pulse Rate: 65  (03/03 1200)  Labs:  Basename 05/20/11 1245 05/20/11 0252 05/19/11 2036 05/19/11 1759 05/19/11 1452 05/19/11 1451 05/19/11 1058 05/19/11 1042  HGB -- 13.7 -- -- 14.2 -- -- --  HCT -- 39.5 -- -- 40.5 -- 41.0 --  PLT -- 235 -- -- 211 -- -- 213  APTT -- -- -- -- 76* -- -- --  LABPROT -- -- -- -- 14.2 -- -- --  INR -- -- -- -- 1.08 -- -- --  HEPARINUNFRC 0.24* 0.33 -- 0.29* -- -- -- --  CREATININE -- 0.73 --  -- 0.68 -- 0.80 --  CKTOTAL -- 224 266* -- -- 243* -- --  CKMB -- 14.6* 19.0* -- -- 17.5* -- --  TROPONINI -- 4.51* 4.71* -- -- 2.75* -- --   Estimated Creatinine Clearance: 91.3 ml/min (by C-G formula based on Cr of 0.73).  Medical History: Past Medical History  Diagnosis Date  . Diabetes mellitus   . Stroke   . Myocardial infarct   . Alcohol abuse   . Coronary artery disease     Medications:  Prescriptions prior to admission  Medication Sig Dispense Refill  . Ascorbic Acid (VITAMIN C PO) Take 1 tablet by mouth daily.      Marland Kitchen aspirin 325 MG tablet Take 325 mg by mouth daily.      . Cyanocobalamin (VITAMIN B 12 PO) Take 1 tablet by mouth daily.      . insulin NPH-insulin regular (NOVOLIN 70/30) (70-30) 100 UNIT/ML injection Inject 20 Units into the skin 3 (three) times daily. 20 units three times daily or 30 units twice daily.  Uses 20 units in the morning, at lunch time if sugar is close to 100 will skip dose, if close to 200 will give 20 units, then 20 units at bedtime.      . metFORMIN (GLUCOPHAGE) 1000 MG tablet Take 1,000 mg by mouth 2 (two) times daily with a meal.      . metoprolol (LOPRESSOR) 100 MG tablet Take 100 mg by mouth 2 (two) times daily.      Marland Kitchen  Multiple Vitamins-Minerals (ZINC PO) Take 1 tablet by mouth daily.      . Pyridoxine HCl (VITAMIN B-6 PO) Take 1 tablet by mouth daily.      . RABEprazole (ACIPHEX) 20 MG tablet Take 20 mg by mouth 2 (two) times daily.        Scheduled:     . aspirin  324 mg Oral NOW   Or  . aspirin  300 mg Rectal NOW  . aspirin  325 mg Oral Daily  . atorvastatin  80 mg Oral q1800  . influenza  inactive virus vaccine  0.5 mL Intramuscular Tomorrow-1000  . insulin aspart  0-15 Units Subcutaneous TID WC  . insulin aspart  0-5 Units Subcutaneous QHS  . insulin aspart protamine-insulin aspart  30 Units Subcutaneous BID WC  . lisinopril  5 mg Oral Daily  . metoprolol  100 mg Oral BID  . pantoprazole  40 mg Oral Q1200  . sodium chloride   3 mL Intravenous Q12H  . DISCONTD: aspirin  325 mg Oral Daily   Infusions:     . heparin 1,150 Units/hr (05/20/11 0702)  . nitroGLYCERIN 10 mcg/min (05/19/11 1900)  . DISCONTD: heparin 1,000 Units/hr (05/19/11 1500)

## 2011-05-20 NOTE — Progress Notes (Signed)
  Echocardiogram 2D Echocardiogram has been performed.  Larry Cooper 05/20/2011, 8:38 AM

## 2011-05-21 ENCOUNTER — Inpatient Hospital Stay (HOSPITAL_COMMUNITY): Payer: Medicare PPO

## 2011-05-21 ENCOUNTER — Other Ambulatory Visit: Payer: Self-pay

## 2011-05-21 ENCOUNTER — Encounter (HOSPITAL_COMMUNITY): Payer: Self-pay | Admitting: Cardiothoracic Surgery

## 2011-05-21 ENCOUNTER — Encounter (HOSPITAL_COMMUNITY)
Admission: EM | Disposition: A | Discharge status: Home / Self Care | Payer: Self-pay | Source: Home / Self Care | Attending: Cardiothoracic Surgery

## 2011-05-21 DIAGNOSIS — I2510 Atherosclerotic heart disease of native coronary artery without angina pectoris: Secondary | ICD-10-CM

## 2011-05-21 DIAGNOSIS — Z01810 Encounter for preprocedural cardiovascular examination: Secondary | ICD-10-CM

## 2011-05-21 DIAGNOSIS — J61 Pneumoconiosis due to asbestos and other mineral fibers: Secondary | ICD-10-CM | POA: Diagnosis present

## 2011-05-21 DIAGNOSIS — Z720 Tobacco use: Secondary | ICD-10-CM

## 2011-05-21 HISTORY — PX: LEFT HEART CATHETERIZATION WITH CORONARY ANGIOGRAM: SHX5451

## 2011-05-21 LAB — CBC
HCT: 37.9 % — ABNORMAL LOW (ref 39.0–52.0)
MCH: 29.7 pg (ref 26.0–34.0)
MCHC: 34.8 g/dL (ref 30.0–36.0)
RDW: 14.1 % (ref 11.5–15.5)

## 2011-05-21 LAB — BASIC METABOLIC PANEL
BUN: 9 mg/dL (ref 6–23)
CO2: 25 mEq/L (ref 19–32)
Calcium: 9.7 mg/dL (ref 8.4–10.5)
Creatinine, Ser: 0.71 mg/dL (ref 0.50–1.35)

## 2011-05-21 LAB — PROTIME-INR: Prothrombin Time: 13.8 seconds (ref 11.6–15.2)

## 2011-05-21 LAB — PULMONARY FUNCTION TEST

## 2011-05-21 LAB — URINALYSIS, ROUTINE W REFLEX MICROSCOPIC
Leukocytes, UA: NEGATIVE
Nitrite: NEGATIVE
Specific Gravity, Urine: 1.028 (ref 1.005–1.030)
pH: 6 (ref 5.0–8.0)

## 2011-05-21 LAB — GLUCOSE, CAPILLARY
Glucose-Capillary: 102 mg/dL — ABNORMAL HIGH (ref 70–99)
Glucose-Capillary: 239 mg/dL — ABNORMAL HIGH (ref 70–99)
Glucose-Capillary: 382 mg/dL — ABNORMAL HIGH (ref 70–99)

## 2011-05-21 LAB — TYPE AND SCREEN
ABO/RH(D): O POS
Antibody Screen: NEGATIVE

## 2011-05-21 SURGERY — LEFT HEART CATHETERIZATION WITH CORONARY ANGIOGRAM
Anesthesia: LOCAL

## 2011-05-21 MED ORDER — DEXTROSE 5 % IV SOLN
750.0000 mg | INTRAVENOUS | Status: DC
  Filled 2011-05-21: qty 750

## 2011-05-21 MED ORDER — PLASMA-LYTE 148 IV SOLN
INTRAVENOUS | Status: AC
  Administered 2011-05-22: 09:00:00
  Filled 2011-05-21: qty 2.5

## 2011-05-21 MED ORDER — VERAPAMIL HCL 2.5 MG/ML IV SOLN
INTRAVENOUS | Status: DC
  Filled 2011-05-21: qty 2.5

## 2011-05-21 MED ORDER — DOPAMINE-DEXTROSE 3.2-5 MG/ML-% IV SOLN
2.0000 ug/kg/min | INTRAVENOUS | Status: DC
  Filled 2011-05-21: qty 250

## 2011-05-21 MED ORDER — MIDAZOLAM HCL 2 MG/2ML IJ SOLN
INTRAMUSCULAR | Status: AC
  Filled 2011-05-21: qty 2

## 2011-05-21 MED ORDER — PHENYLEPHRINE HCL 10 MG/ML IJ SOLN
30.0000 ug/min | INTRAVENOUS | Status: DC
  Filled 2011-05-21: qty 2

## 2011-05-21 MED ORDER — VERAPAMIL HCL 2.5 MG/ML IV SOLN
INTRAVENOUS | Status: AC
  Filled 2011-05-21: qty 2

## 2011-05-21 MED ORDER — LACTATED RINGERS IV SOLN
INTRAVENOUS | Status: DC
  Administered 2011-05-22 (×4): via INTRAVENOUS

## 2011-05-21 MED ORDER — FENTANYL CITRATE 0.05 MG/ML IJ SOLN: 50.0000 ug | INTRAMUSCULAR | Status: DC | PRN

## 2011-05-21 MED ORDER — SODIUM CHLORIDE 0.9 % IV SOLN
1.0000 mL/kg/h | INTRAVENOUS | Status: AC
  Administered 2011-05-22: 13:00:00 via INTRAVENOUS

## 2011-05-21 MED ORDER — SODIUM CHLORIDE 0.9 % IV SOLN
1500.0000 mg | INTRAVENOUS | Status: AC
  Administered 2011-05-22: 07:00:00 1500 mg via INTRAVENOUS
  Filled 2011-05-21: qty 1500

## 2011-05-21 MED ORDER — CHLORHEXIDINE GLUCONATE 4 % EX LIQD
60.0000 mL | Freq: Once | CUTANEOUS | Status: AC
  Administered 2011-05-21: 4 via TOPICAL

## 2011-05-21 MED ORDER — CHLORHEXIDINE GLUCONATE 4 % EX LIQD
60.0000 mL | Freq: Once | CUTANEOUS | Status: AC
  Administered 2011-05-22: 4 via TOPICAL
  Filled 2011-05-21: qty 60

## 2011-05-21 MED ORDER — OXYCODONE-ACETAMINOPHEN 5-325 MG PO TABS
1.0000 | ORAL_TABLET | ORAL | Status: DC | PRN
  Administered 2011-05-21: 2 via ORAL
  Filled 2011-05-21: qty 2

## 2011-05-21 MED ORDER — SODIUM CHLORIDE 0.9 % IV SOLN
0.1000 ug/kg/h | INTRAVENOUS | Status: AC
  Administered 2011-05-22: .2 ug/kg/h via INTRAVENOUS
  Filled 2011-05-21 (×2): qty 4

## 2011-05-21 MED ORDER — ENOXAPARIN SODIUM 40 MG/0.4ML ~~LOC~~ SOLN: 40.0000 mg | SUBCUTANEOUS | Status: DC

## 2011-05-21 MED ORDER — TEMAZEPAM 7.5 MG PO CAPS: 15.0000 mg | ORAL_CAPSULE | Freq: Once | ORAL | Status: AC | PRN

## 2011-05-21 MED ORDER — METOPROLOL TARTRATE 12.5 MG HALF TABLET
12.5000 mg | ORAL_TABLET | Freq: Once | ORAL | Status: DC
  Filled 2011-05-21: qty 1

## 2011-05-21 MED ORDER — SODIUM CHLORIDE 0.9 % IV SOLN
INTRAVENOUS | Status: AC
  Administered 2011-05-22: 70 mL/h via INTRAVENOUS
  Filled 2011-05-21: qty 40

## 2011-05-21 MED ORDER — CHLORHEXIDINE GLUCONATE 4 % EX LIQD
60.0000 mL | Freq: Once | CUTANEOUS | Status: DC
  Filled 2011-05-21: qty 45
  Filled 2011-05-21: qty 15

## 2011-05-21 MED ORDER — POTASSIUM CHLORIDE 2 MEQ/ML IV SOLN
80.0000 meq | INTRAVENOUS | Status: DC
  Filled 2011-05-21: qty 40

## 2011-05-21 MED ORDER — ACETAMINOPHEN 325 MG PO TABS: 650.0000 mg | ORAL_TABLET | ORAL | Status: DC | PRN

## 2011-05-21 MED ORDER — HEPARIN (PORCINE) IN NACL 100-0.45 UNIT/ML-% IJ SOLN
1400.0000 [IU]/h | INTRAMUSCULAR | Status: DC
  Administered 2011-05-21 – 2011-05-22 (×2): 1400 [IU]/h via INTRAVENOUS
  Filled 2011-05-21 (×2): qty 250

## 2011-05-21 MED ORDER — LIDOCAINE HCL (PF) 1 % IJ SOLN
INTRAMUSCULAR | Status: AC
  Filled 2011-05-21: qty 30

## 2011-05-21 MED ORDER — FENTANYL CITRATE 0.05 MG/ML IJ SOLN
INTRAMUSCULAR | Status: AC
Start: 2011-05-21 — End: 2011-05-21
  Filled 2011-05-21: qty 2

## 2011-05-21 MED ORDER — NITROGLYCERIN IN D5W 200-5 MCG/ML-% IV SOLN
2.0000 ug/min | INTRAVENOUS | Status: AC
  Administered 2011-05-22: 16.7 ug/min via INTRAVENOUS
  Filled 2011-05-21: qty 250

## 2011-05-21 MED ORDER — MAGNESIUM SULFATE 50 % IJ SOLN
40.0000 meq | INTRAMUSCULAR | Status: DC
  Filled 2011-05-21: qty 10

## 2011-05-21 MED ORDER — BISACODYL 5 MG PO TBEC: 5.0000 mg | DELAYED_RELEASE_TABLET | Freq: Once | ORAL | Status: DC

## 2011-05-21 MED ORDER — SODIUM CHLORIDE 0.9 % IV SOLN
INTRAVENOUS | Status: AC
  Administered 2011-05-22: .7 [IU]/h via INTRAVENOUS
  Filled 2011-05-21: qty 1

## 2011-05-21 MED ORDER — NITROGLYCERIN 0.2 MG/ML ON CALL CATH LAB
INTRAVENOUS | Status: AC
  Filled 2011-05-21: qty 1

## 2011-05-21 MED ORDER — EPINEPHRINE HCL 1 MG/ML IJ SOLN
0.5000 ug/min | INTRAVENOUS | Status: DC
  Filled 2011-05-21: qty 4

## 2011-05-21 MED ORDER — HEPARIN (PORCINE) IN NACL 2-0.9 UNIT/ML-% IJ SOLN
INTRAMUSCULAR | Status: AC
  Filled 2011-05-21: qty 2000

## 2011-05-21 MED ORDER — DEXTROSE 5 % IV SOLN
1.5000 g | INTRAVENOUS | Status: AC
  Administered 2011-05-22: 1.5 g via INTRAVENOUS
  Administered 2011-05-22: 750 g via INTRAVENOUS
  Filled 2011-05-21: qty 1.5

## 2011-05-21 MED ORDER — ONDANSETRON HCL 4 MG/2ML IJ SOLN: 4.0000 mg | Freq: Four times a day (QID) | INTRAMUSCULAR | Status: DC | PRN

## 2011-05-21 MED ORDER — MIDAZOLAM HCL 2 MG/2ML IJ SOLN: 1.0000 mg | INTRAMUSCULAR | Status: DC | PRN

## 2011-05-21 NOTE — Progress Notes (Signed)
   CARE MANAGEMENT NOTE 05/21/2011  Patient:  Larry Cooper, Larry Cooper   Account Number:  [id number]  Date Initiated:  05/21/2011  Documentation initiated by:  GRAVES-BIGELOW,Terrisha Lopata  Subjective/Objective Assessment:   Pt admitted with Chest pain. Plan for CABG in am.     Action/Plan:   Anticipated DC Date:  05/27/2011   Anticipated DC Plan:  HOME W HOME HEALTH SERVICES      DC Planning Services  CM consult      Choice offered to / List presented to:             Status of service:   Medicare Important Message given?   (If response is "NO", the following Medicare IM given date fields will be blank) Date Medicare IM given:   Date Additional Medicare IM given:    Discharge Disposition:    Per UR Regulation:    Comments:  05-21-11 1559 Tomi Bamberger, RN,BSN [phone] CM will continue to monitor for d/c disposition.

## 2011-05-21 NOTE — CV Procedure (Signed)
PROCEDURE:  Left heart catheterization with selective coronary angiography, left ventriculogram via the radial artery approach.  INDICATIONS:  71 year old male with non-ST elevation myocardial infarction, troponin of 4, normal left ventricular ejection fraction by echocardiogram, coronary artery disease with previously placed stents in the LAD and circumflex artery over the past 10 years, diabetes, ongoing tobacco use here for cardiac catheterization.  The risks, benefits, and details of the procedure were explained to the patient, including possibilities of stroke, heart attack, death, renal impairment, arterial damage, bleeding.  The patient verbalized understanding and wanted to proceed.  Informed written consent was obtained.  PROCEDURE TECHNIQUE:  Arrin's test was performed pre-and post procedure and was normal. The right radial artery site was prepped and draped in a sterile fashion. One percent lidocaine was used for local anesthesia. Using the modified Seldinger technique a 5 French hydrophilic sheath was inserted into the radial artery without difficulty. 3 mg of verapamil was administered via the sheath. A Judkins right #4 catheter with the guidance of a Versicore wire was placed in the right coronary cusp and selectively cannulated the right coronary artery. After traversing the aortic arch, 4000 units of heparin IV was administered. A Judkins left #3.5 catheter was used to selectively cannulate the left main artery. Multiple views with hand injection of Omnipaque were obtained. Catheter a pigtail catheter was used to cross into the left ventricle, hemodynamics were obtained, and a left ventriculogram was performed in the RAO position with power injection. Following the procedure, sheath was removed, patient was hemodynamically stable, hemostasis was maintained with a Terumo T band.   CONTRAST:  Total of 110 ml.    FLOUROSCOPY TIME: 4.5 min.  COMPLICATIONS:  None.    HEMODYNAMICS:  Aortic  pressure was 151/76 with a mean of 107 mmHg; LV systolic pressure was 151 mmHg; LVEDP 16 mmHg.  There was no gradient between the left ventricle and aorta.    ANGIOGRAPHIC DATA:    Left main: There is mild tapering/stenosis in the distal left main of up to 30%, this bifurcates into the LAD as well as circumflex artery.  Left anterior descending (LAD): There is a long segment of LAD stenting from the proximal to mid segment encompassing the second and third diagonal branch. The first diagonal branch is quite small in caliber. Within the mid to distal segment of the previously placed stent there is up to 90% in-stent stenosis at the bifurcation of the third diagonal. There is also distal LAD stent with a step down/stenosis of up to 80% just distal to the previously placed stent placement. The second and third diagonal both have ostial/proximal disease of up to 70-80%.  Circumflex artery (CIRC): There is a high first obtuse marginal branch which is moderate in caliber with a tented mid segment but otherwise only mild diffuse disease throughout this vessel. The second obtuse marginal has a previously placed stent in its mid segment. Proximal to the stent however there is a 95% tubular stenosis.  Right coronary artery (RCA): This artery is diffusely diseased especially in its distal segment. There does not appear to be any previously placed stents in this vessel. The proximal segment has approximately 70% stenosis, diffuse. There significant stenosis at the bifurcation of the second acute marginal branch. The right coronary gives rise to the posterior descending artery which appears widely patent with mild to moderate diffuse disease.  LEFT VENTRICULOGRAM:  Left ventricular angiogram was done in the 30 RAO projection and revealed normal left ventricular wall motion and  systolic function with an estimated ejection fraction of 70 %.   IMPRESSIONS:  Significant three-vessel coronary artery disease as  follows: Proximal right coronary artery of up to 70%, mid LAD in-stent stenosis of up to 90%, second and third diagonal stenosis of 70-80%, first obtuse marginal stenosis proximal to previously placed stent of up to 90%.  Normal left ventricular systolic function.  LVEDP 18 mmHg.  Ejection fraction 70 %.  RECOMMENDATION:  I have discussed films with Dr.Varanasi and given the diffuse nature of his disease, I would like to proceed with a surgical consult given his diabetes and triple vessel disease. I do understand that surgical revascularization may be limited by previously placed stents. I will seek their opinion and expertise. I have discussed with Mr. Trice.

## 2011-05-21 NOTE — Interval H&P Note (Signed)
History and Physical Interval Note:  05/21/2011 6:40 AM  Larry Cooper  has presented today for surgery, with the diagnosis of cp  The various methods of treatment have been discussed with the patient and family. After consideration of risks, benefits and other options for treatment, the patient has consented to  Procedure(s) (LRB): LEFT HEART CATHETERIZATION WITH CORONARY ANGIOGRAM (N/A) as a surgical intervention .  The patients' history has been reviewed, patient examined, no change in status, stable for surgery.  I have reviewed the patients' chart and labs.  Questions were answered to the patient's satisfaction.     [name]

## 2011-05-21 NOTE — Progress Notes (Signed)
Anticoag: no lovenox. Talked to Dr. Anne Fu and he said ok to start heparin 8 hours after sheath was removed and d/c on call to surgery tom. CABG in am. Per RN, TR band was removed at ~1030. Goal 0.3-0.7  Plan. 1) Start heparin at 1400 units/hr (=74ml/hr) at 1830 tonight. No bolus. 2) Check a heparin level at 0030 on 05/22/11. 3) d/c heparin on call to OR  4) follow up after CABG tom.

## 2011-05-21 NOTE — Progress Notes (Addendum)
Pre-op Cardiac Surgery  Carotid Findings:  Bilateral:  No evidence of hemodynamically significant internal carotid artery stenosis.   Vertebral artery flow is antegrade.      Upper Extremity Right Left  Brachial Pressures 177 T 161 T  Radial Waveforms Tri Tri  Ulnar Waveforms Tri Tri  Palmar Arch (Tripp's Test) Right:  Doppler obliterates with radial compression; normal  with ulnar compression. Left:  Within normal limits      Lower  Extremity Right Left  Dorsalis Pedis Bi Bi  Anterior Tibial    Posterior Tibial Tri Tri  Ankle/Brachial Indices Within normal limits  Within normal limits     Terance Hart, RVT 05/21/2011 3:49 PM

## 2011-05-21 NOTE — Progress Notes (Signed)
ANTICOAGULATION CONSULT NOTE - Follow Up Consult  Pharmacy Consult for heparin Indication: chest pain/ACS  Labs:  Basename 05/21/11 0555 05/20/11 2004 05/20/11 1245 05/20/11 0252 05/19/11 2036 05/19/11 1452 05/19/11 1451 05/19/11 1058  HGB 13.2 -- -- 13.7 -- -- -- --  HCT 37.9* -- -- 39.5 -- 40.5 -- --  PLT 205 -- -- 235 -- 211 -- --  APTT -- -- -- -- -- 76* -- --  LABPROT 13.8 -- -- -- -- 14.2 -- --  INR 1.04 -- -- -- -- 1.08 -- --  HEPARINUNFRC 0.64 0.28* 0.24* -- -- -- -- --  CREATININE -- -- -- 0.73 -- 0.68 -- 0.80  CKTOTAL -- -- -- 224 266* -- 243* --  CKMB -- -- -- 14.6* 19.0* -- 17.5* --  TROPONINI -- -- -- 4.51* 4.71* -- 2.75* --   Assessment/Plan: 71yo male now therapeutic on heparin, in cath lab now.  Will f/u after cath.   Colleen Can PharmD BCPS 05/21/2011,8:05 AM

## 2011-05-21 NOTE — Consult Note (Signed)
Pt is a 1 ppd smoker and not too happy about quitting but says" I'm gonna have to". He verbalizes understanding of the risk factors. At this time he plans to quit cold [place]. Referred to 1-800 quit now for f/u and support. Discussed oral fixation substitutes, second hand smoke and in home smoking policy. Reviewed and gave pt Written education/contact information.

## 2011-05-21 NOTE — Consult Note (Signed)
[address]            [place] [address]          [phone]       Larry Cooper [facility] Medical Record #[id number] Date of Birth: Jan 06, 1941  Referring:Dr [facility] Primary Care: None  Chief Complaint:    Chief Complaint  Patient presents with  . Chest Pain    Saturday Mourning 05/19/2011    History of Present Illness:     Patient is a 71 year old male with known cigarette use diabetes and known coronary occlusive disease)  with new onset of chest discomfort with nausea on Saturday. He was stabilized medically but was noted to have elevated enzymes, he underwent cardiac catheterization by Dr. Chales Abrahams today.  He has a previous history of known coronary occlusive disease. In 2002 he had a stent placed in the circumflex coronary artery at [facility]. In September of 2009 he had 3 stents placed in the LAD drug-eluting once drug-eluting stent placed in the circumflex and balloon angioplasty of the existing stent. In November 2008 he he occluded his mid to distal LAD and suffered an acute anterior myocardial infarction. He had stopped his Plavix 2 weeks prior to this distal LAD stent was placed. In August of 2009 an additional stent was placed in the LAD. The subsequent studies and procedures were performed in [place], [place]    Current Activity/ Functional Status: Patient is  independent with mobility/ambulation, transfers, ADL's, IADL's.   Past Medical History  Diagnosis Date  . Diabetes mellitus   . Stroke   . Myocardial infarct   . Alcohol abuse   . Coronary artery disease   . Asbestosis     Past Surgical History  Procedure Date  . Heart stents   . Appendectomy     History  Smoking status  . Current Everyday Smoker -- 1.0 packs/day  . Types: Cigarettes         History  Alcohol Use  . Yes patient notes he is binge drinker, denied alcohol use for past 3 weeks Blood alcohol elevated on admission    History   Social  History  . Marital Status: Divorced    Spouse Name: N/A    Number of Children: N/A  . Years of Education: N/A   Occupational History  . 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 Smoking status: Current Everyday Smoker -- 1.0 packs/day    Types: Cigarettes  . Smokeless tobacco: Not on file  . Alcohol Use: Yes  . Drug Use: No  . Sexually Active:     Social History Narrative  .  daughter lives with him    Allergies  Allergen Reactions  . Codeine Nausea And Vomiting, Swelling and Other (See Comments)    Blisters and swelling    Current Facility-Administered Medications  Medication Dose Route Frequency Provider Last Rate Last Dose  . 0.9 %  sodium chloride infusion  250 mL Intravenous PRN Donato Schultz, MD      . 0.9 %  sodium chloride infusion  1 mL/kg/hr Intravenous Continuous Donato Schultz, MD      . acetaminophen (TYLENOL) tablet 650 mg  650 mg Oral Q4H PRN Donato Schultz, MD      . acetaminophen (TYLENOL) tablet 650 mg  650 mg Oral Q4H PRN Donato Schultz, MD      .  alum & mag hydroxide-simeth (MAALOX/MYLANTA) 200-200-20 MG/5ML suspension 30 mL  30 mL Oral Q4H PRN Donato Schultz, MD   30 mL at 05/20/11 2110  . aspirin chewable tablet 324 mg  324 mg Oral NOW Donato Schultz, MD       Or  . aspirin suppository 300 mg  300 mg Rectal NOW Donato Schultz, MD      . aspirin chewable tablet 324 mg  324 mg Oral Pre-Cath Donato Schultz, MD   324 mg at 05/21/11 1610  . aspirin tablet 325 mg  325 mg Oral Daily Donato Schultz, MD   325 mg at 05/20/11 9604  . atorvastatin (LIPITOR) tablet 80 mg  80 mg Oral q1800 Donato Schultz, MD   80 mg at 05/20/11 1738  . diazepam (VALIUM) tablet 5 mg  5 mg Oral On Call Donato Schultz, MD   5 mg at 05/21/11 0646  . enoxaparin (LOVENOX) injection 40 mg  40 mg Subcutaneous Q24H Donato Schultz, MD      . fentaNYL (SUBLIMAZE) 0.05 MG/ML injection           . heparin 2-0.9 UNIT/ML-% infusion           . influenza  inactive virus vaccine (FLUZONE/FLUARIX)  injection 0.5 mL  0.5 mL Intramuscular Tomorrow-1000 Donato Schultz, MD      . insulin aspart (novoLOG) injection 0-15 Units  0-15 Units Subcutaneous TID WC Donato Schultz, MD   2 Units at 05/20/11 1741  . insulin aspart (novoLOG) injection 0-5 Units  0-5 Units Subcutaneous QHS Donato Schultz, MD      . insulin aspart protamine-insulin aspart (NOVOLOG 70/30) injection 30 Units  30 Units Subcutaneous BID WC Donato Schultz, MD   30 Units at 05/20/11 1742  . lidocaine (XYLOCAINE) 1 % injection           . lisinopril (PRINIVIL,ZESTRIL) tablet 5 mg  5 mg Oral Daily Donato Schultz, MD   5 mg at 05/20/11 0929  . metoprolol (LOPRESSOR) tablet 100 mg  100 mg Oral BID Donato Schultz, MD   100 mg at 05/20/11 2110  . midazolam (VERSED) 2 MG/2ML injection           . nitroGLYCERIN (NITROSTAT) SL tablet 0.4 mg  0.4 mg Sublingual Q5 Min x 3 PRN Donato Schultz, MD      . nitroGLYCERIN (NTG ON-CALL) 0.2 mg/mL injection           . nitroGLYCERIN 0.2 mg/mL in dextrose 5 % infusion  5 mcg/min Intravenous Titrated Fayrene Helper, PA-C 3 mL/hr at 05/19/11 1900 10 mcg/min at 05/19/11 1900  . ondansetron (ZOFRAN) injection 4 mg  4 mg Intravenous Q6H PRN Donato Schultz, MD      . ondansetron [facility]) injection 4 mg  4 mg Intravenous Q6H PRN Donato Schultz, MD      . oxyCODONE-acetaminophen (PERCOCET) 5-325 MG per tablet 1-2 tablet  1-2 tablet Oral Q4H PRN Donato Schultz, MD      . pantoprazole (PROTONIX) EC tablet 40 mg  40 mg Oral Q1200 Donato Schultz, MD   40 mg at 05/20/11 1139  . sodium chloride 0.9 % injection 3 mL  3 mL Intravenous Q12H Donato Schultz, MD   3 mL at 05/20/11 0935  . sodium chloride 0.9 % injection 3 mL  3 mL Intravenous PRN Donato Schultz, MD      . verapamil (ISOPTIN) 2.5 MG/ML injection             Prescriptions prior to admission  Medication  Sig Dispense Refill  . Ascorbic Acid (VITAMIN C PO) Take 1 tablet by mouth daily.      Marland Kitchen aspirin 325 MG tablet Take 325 mg by mouth daily.      . Cyanocobalamin (VITAMIN B 12 PO) Take 1 tablet by  mouth daily.      . insulin NPH-insulin regular (NOVOLIN 70/30) (70-30) 100 UNIT/ML injection Inject 20 Units into the skin 3 (three) times daily. 20 units three times daily or 30 units twice daily.  Uses 20 units in the morning, at lunch time if sugar is close to 100 will skip dose, if close to 200 will give 20 units, then 20 units at bedtime.      . metFORMIN (GLUCOPHAGE) 1000 MG tablet Take 1,000 mg by mouth 2 (two) times daily with a meal.      . metoprolol (LOPRESSOR) 100 MG tablet Take 100 mg by mouth 2 (two) times daily.      . Multiple Vitamins-Minerals (ZINC PO) Take 1 tablet by mouth daily.      . Pyridoxine HCl (VITAMIN B-6 PO) Take 1 tablet by mouth daily.      . RABEprazole (ACIPHEX) 20 MG tablet Take 20 mg by mouth 2 (two) times daily.         History reviewed. No pertinent family history.   Review of Systems:     Cardiac Review of Systems: Y or N  Chest Pain [  y  ]  Resting SOB [n   ] Exertional SOB  Cove.Etienne  ]  Orthopnea Cove.Etienne  ]   Pedal Edema [ n  ]    Palpitations Milo.Brash  ] Syncope  [ n ]   Presyncope [ n  ]  General Review of Systems: [Y] = yes [  ]=no Constitional: recent weight change [ n ]; anorexia [n]; fatigue Cove.Etienne  ]; nausea [ y ]; night sweats [ n ]; fever [ n ]; or chills [ n ];                                                                                                                                          Dental: poor dentition[ very poor  ]; Last Dentist visit: unknown  Eye : blurred vision [ y ]; diplopia [   n]; vision changes [ n ];  Amaurosis fugax[ n ]; Resp: cough Cove.Etienne  ];  wheezing[ n ];  hemoptysis[  ]; shortness of breath[y  ]; paroxysmal nocturnal dyspnea[ y ]; dyspnea on exertion[y  ]; or orthopnea[n  ];  GI:  gallstones[  ], vomiting[  ];  dysphagia[  ]; melena[ n ];  hematochezia [  ]; heartburn[ y ];   Hx of  Colonoscopy[ n ]; GU: Cooper stones [ n ]; hematuria[ n ];   dysuria [  ];  nocturia[  ];  history of     obstruction [  ];  Skin: rash,  swelling[  ];, hair loss[  ];  peripheral edema[  ];  or itching[  ]; Musculosketetal: myalgias[  ];  joint swelling[  ];  joint erythema[  ];  joint pain[  ];  back pain[  ];  Heme/Lymph: bruising[n  ];  bleeding[ n ];  anemia[ n ];  Neuro: TIA[  ];  headaches[  ];  stroke[  ];  vertigo[ n ];  seizures[ n ];   paresthesias[  n];  difficulty walking[y  ];  Psych:depression[  ]; anxiety[  ];  Endocrine: diabetes[  ];  thyroid dysfunction[  ];  Immunizations: Flu Milo.Brash  ]; Pneumococcal[ n ];  Flu vacgiven in hospital  Other:  Physical Exam: BP 169/76  Pulse 68  Temp(Src) 97.8 F (36.6 C) (Oral)  Resp 21  Ht 5\' 10"  (1.778 m)  Wt 191 lb 2.2 oz (86.7 kg)  BMI 27.43 kg/m2  SpO2 96%  General appearance: alert, cooperative and no distress Neurologic: intact Heart: regular rate and rhythm, S1, S2 normal, no murmur, click, rub or gallop Lungs: clear to auscultation bilaterally Abdomen: soft, non-tender; bowel sounds normal; no masses,  no organomegaly Extremities: extremities normal, atraumatic, no cyanosis or edema, Homans sign is negative, no sign of DVT and appears to have adquate vein for bypass no carotid bruits Rt arm cath site ok 2+ dp and PT pulses bilaterial   Diagnostic Studies & Laboratory data:     Recent Radiology Findings:  Dg Chest 2 View  05/19/2011  *RADIOLOGY REPORT*  Clinical Data: Chest pain and shortness of breath since this morning.  History of smoking, hypertension, diabetes, emphysema, and asbestosis.  CHEST - 2 VIEW  Comparison: None.  Findings: Heart size is normal. There are numerous opacities overlying the lungs bilaterally.  At the lung base, there is calcified pleural plaque.  This may account for the lung densities but pulmonary masses are not excluded.  No definite consolidations. No pleural effusions.  Degenerative changes are seen in the spine. There is no evidence for pulmonary edema.  IMPRESSION:  1.  Normal heart size. 2.  Multiple lung opacities.  I would  favor that the cause of these is multiple calcified pleural plaques.  However, because pulmonary masses are not excluded, CT of the chest with contrast is recommended.  Original Report Authenticated By: Patterson Hammersmith, M.D.     Recent Lab Findings: Lab Results  Component Value Date   WBC 7.3 05/21/2011   HGB 13.2 05/21/2011   HCT 37.9* 05/21/2011   PLT 205 05/21/2011   GLUCOSE 84 05/21/2011   CHOL 124 05/20/2011   TRIG 50 05/20/2011   HDL 53 05/20/2011   LDLCALC 61 05/20/2011   ALT 25 05/19/2011   AST 32 05/19/2011   NA 139 05/21/2011   K 3.8 05/21/2011   CL 105 05/21/2011   CREATININE 0.71 05/21/2011   BUN 9 05/21/2011   CO2 25 05/21/2011   TSH 1.637 05/19/2011   INR 1.04 05/21/2011   HGBA1C 8.0* 05/20/2011    Lab Results  Component Value Date   CKTOTAL 224 05/20/2011   CKMB 14.6* 05/20/2011   TROPONINI 4.51* 05/20/2011   ECHO: Patient: Larry, Cooper MR #: [id number] Study Date: 05/20/2011 Gender: M Age: 79 Height: 177.8cm Weight: 86.2kg BSA: 2.42m^2 Pt. Status: Room: PDA11  PERFORMING [facility] Cardiology, Ec ORDERING Donato Schultz, MD SONOGRAPHER Georgian Co, RDCS, CCT cc:  ------------------------------------------------------------ LV EF: 65% - 70%  ------------------------------------------------------------ Indications: MI - acute 410.91.  ------------------------------------------------------------ History:  PMH: Coronary artery disease. Risk factors: Multiple stents placed. History of CVA. Current tobacco use. Diabetes mellitus.  ------------------------------------------------------------ Study Conclusions  - Left ventricle: The cavity size was normal. There was mild concentric hypertrophy. Systolic function was vigorous. The estimated ejection fraction was in the range of 65% to 70%. Wall motion was normal; there were no regional wall motion abnormalities. Doppler parameters are consistent with abnormal left ventricular relaxation (grade 1 diastolic dysfunction). - Mitral  valve: Calcified annulus. Mild regurgitation. - Left atrium: The atrium was mildly dilated. - Pulmonary arteries: Systolic pressure was mildly increased. PA peak pressure: 33mm Hg (S). Transthoracic echocardiography. M-mode, complete 2D, spectral Doppler, and color Doppler. Height: Height: 177.8cm. Height: 70in. Weight: Weight: 86.2kg. Weight: 189.6lb. Body mass index: BMI: 27.3kg/m^2. Body surface area: BSA: 2.59m^2. Blood pressure: 132/66. Patient status: Inpatient. Location: Bedside.  ------------------------------------------------------------  ------------------------------------------------------------ Left ventricle: The cavity size was normal. There was mild concentric hypertrophy. Systolic function was vigorous. The estimated ejection fraction was in the range of 65% to 70%. Wall motion was normal; there were no regional wall motion abnormalities. Doppler parameters are consistent with abnormal left ventricular relaxation (grade 1 diastolic dysfunction).  ------------------------------------------------------------ Aortic valve: Mildly thickened, mildly calcified leaflets. Cusp separation was normal. Doppler: Transvalvular velocity was within the normal range. There was no stenosis. No regurgitation.  ------------------------------------------------------------ Aorta: The aorta was normal, not dilated, and non-diseased.  ------------------------------------------------------------ Mitral valve: Calcified annulus. Leaflet separation was normal. Doppler: Transvalvular velocity was within the normal range. There was no evidence for stenosis. Mild regurgitation. Peak gradient: 3mm Hg (D).  ------------------------------------------------------------ Left atrium: The atrium was mildly dilated.  ------------------------------------------------------------ Right ventricle: The cavity size was normal. Wall thickness was normal. Systolic function was  normal.  ------------------------------------------------------------ Pulmonic valve: Structurally normal valve. Cusp separation was normal. Doppler: Transvalvular velocity was within the normal range. No regurgitation.  ------------------------------------------------------------ Tricuspid valve: Structurally normal valve. Leaflet separation was normal. Doppler: Transvalvular velocity was within the normal range. Mild regurgitation.  ------------------------------------------------------------ Pulmonary artery: The main pulmonary artery was normal-sized. Systolic pressure was mildly increased.  ------------------------------------------------------------ Right atrium: The atrium was normal in size.  ------------------------------------------------------------ Pericardium: The pericardium was normal in appearance. There was no pericardial effusion.  ------------------------------------------------------------ Systemic veins: Inferior vena cava: The vessel was normal in size; the respirophasic diameter changes were in the normal range (= 50%); findings are consistent with normal central venous pressure.  ------------------------------------------------------------ Post procedure conclusions Ascending Aorta:  - The aorta was normal, not dilated, and non-diseased.  ------------------------------------------------------------  2D measurements Normal Doppler Normal Left ventricle measurements LVID ED, 41.8 mm 43-52 Main pulmonary chord, artery PLAX Pressure, S 33 mm =30 LVID ES, 32.3 mm 23-38 Hg chord, Left ventricle PLAX Ea, lat 7.79 cm/ ------- FS, chord, 23 % >29 ann, tiss s PLAX DP LVPW, ED 13.3 mm ------ E/Ea, lat 11.98 ------- IVS/LVPW 1.15 <1.3 ann, tiss ratio, ED DP Ventricular septum Ea, med 6.58 cm/ ------- IVS, ED 15.3 mm ------ ann, tiss s Aorta DP Root diam 33 mm ------ E/Ea, med 14.18 ------- Root diam, 33 mm ------ ann, tiss ED DP Left atrium Mitral  valve AP dim 45 mm ------ Peak E vel 93.3 cm/ ------- AP dim 2.21 cm/m^2 <2.2 s index Peak A vel 93.8 cm/ ------- s Deceleratio 253 ms 150-230 n time Peak 3 mm ------- gradient, D Hg Peak E/A 1 ------- ratio Tricuspid valve Regurg peak 263 cm/ ------- vel s Peak RV-RA 28 mm ------- gradient, S Hg Right ventricle Pressure, S 33 mm <30  Hg Sa vel, lat 12.2 cm/ ------- ann, tiss s DP  ------------------------------------------------------------ Prepared and Electronically Authenticated by  Donato Schultz, MD 2013-03-03T12:20:03.250  CATH : Left heart catheterization with selective coronary angiography, left ventriculogram via the radial artery approach.  INDICATIONS: 71 year old male with non-ST elevation myocardial infarction, troponin of 4, normal left ventricular ejection fraction by echocardiogram, coronary artery disease with previously placed stents in the LAD and circumflex artery over the past 10 years, diabetes, ongoing tobacco use here for cardiac catheterization.  The risks, benefits, and details of the procedure were explained to the patient, including possibilities of stroke, heart attack, death, renal impairment, arterial damage, bleeding. The patient verbalized understanding and wanted to proceed. Informed written consent was obtained.  PROCEDURE TECHNIQUE: Margarito's test was performed pre-and post procedure and was normal. The right radial artery site was prepped and draped in a sterile fashion. One percent lidocaine was used for local anesthesia. Using the modified Seldinger technique a 5 French hydrophilic sheath was inserted into the radial artery without difficulty. 3 mg of verapamil was administered via the sheath. A Judkins right #4 catheter with the guidance of a Versicore wire was placed in the right coronary cusp and selectively cannulated the right coronary artery. After traversing the aortic arch, 4000 units of heparin IV was administered. A Judkins left #3.5  catheter was used to selectively cannulate the left main artery. Multiple views with hand injection of Omnipaque were obtained. Catheter a pigtail catheter was used to cross into the left ventricle, hemodynamics were obtained, and a left ventriculogram was performed in the RAO position with power injection. Following the procedure, sheath was removed, patient was hemodynamically stable, hemostasis was maintained with a Terumo T band.  CONTRAST: Total of 110 ml.  FLOUROSCOPY TIME: 4.5 min.  COMPLICATIONS: None.  HEMODYNAMICS: Aortic pressure was 151/76 with a mean of 107 mmHg; LV systolic pressure was 151 mmHg; LVEDP 16 mmHg. There was no gradient between the left ventricle and aorta.  ANGIOGRAPHIC DATA:  Left main: There is mild tapering/stenosis in the distal left main of up to 30%, this bifurcates into the LAD as well as circumflex artery.  Left anterior descending (LAD): There is a long segment of LAD stenting from the proximal to mid segment encompassing the second and third diagonal branch. The first diagonal branch is quite small in caliber. Within the mid to distal segment of the previously placed stent there is up to 90% in-stent stenosis at the bifurcation of the third diagonal. There is also distal LAD stent with a step down/stenosis of up to 80% just distal to the previously placed stent placement. The second and third diagonal both have ostial/proximal disease of up to 70-80%.  Circumflex artery (CIRC): There is a high first obtuse marginal branch which is moderate in caliber with a tented mid segment but otherwise only mild diffuse disease throughout this vessel. The second obtuse marginal has a previously placed stent in its mid segment. Proximal to the stent however there is a 95% tubular stenosis.  Right coronary artery (RCA): This artery is diffusely diseased especially in its distal segment. There does not appear to be any previously placed stents in this vessel. The proximal segment has  approximately 70% stenosis, diffuse. There significant stenosis at the bifurcation of the second acute marginal branch. The right coronary gives rise to the posterior descending artery which appears widely patent with mild to moderate diffuse disease.  LEFT VENTRICULOGRAM: Left ventricular angiogram was done in the 30 RAO projection and  revealed normal left ventricular wall motion and systolic function with an estimated ejection fraction of 70 %.  IMPRESSIONS:  1. Significant three-vessel coronary artery disease as follows: Proximal right coronary artery of up to 70%, mid LAD in-stent stenosis of up to 90%, second and third diagonal stenosis of 70-80%, first obtuse marginal stenosis proximal to previously placed stent of up to 90%.  2. Normal left ventricular systolic function. LVEDP 18 mmHg. Ejection fraction 70 %. RECOMMENDATION: I have discussed films with Dr.Varanasi and given the diffuse nature of his disease, I would like to proceed with a surgical consult given his diabetes and triple vessel disease. I do understand that surgical revascularization may be limited by previously placed stents. I will seek their opinion and expertise. I have discussed with Larry Cooper.      Assessment / Plan:    Acute myocardial infarction with significant three-vessel coronary artery disease and diabetes with multiple drug-eluting stents already in place and has discontinued Plavix Ongoing tobacco abuse Poorly controlled diabetes patient currently under no medical supervision for the care of his diabetes History of asbestosis, the details of this workup we do not have the patient does have evidence of pleural plaques on chest x-ray and history of asbestos exposure.  Radiology has recommended a CT to further evaluate the extent of his lung disease. Will obtain a noncontrasted CT scan of the chest preoperatively. His coronary artery disease is problematic, with current medical therapy is presented again with a  myocardial infarction. Further stenting is not an option. I've reviewed the films and discussed the pros and cons of coronary artery bypass grafting versus medical therapy with the patient and his daughter. He is aware that his of increased risk of bypass surgery because of poor quality distal vessels ongoing tobacco use and diabetes. However with medical therapy alone is very likely he will present again just as he did Saturday.  The goals risks and alternatives of the planned surgical procedure  CABG  have been discussed with the patient in detail. The risks of the procedure including death, infection, stroke, myocardial infarction, bleeding, blood transfusion have all been discussed specifically.  I have quoted Ambrose Mantle a 8 % of perioperative mortality and a complication rate as high as 25%. The patient's questions have been answered.Larry Cooper is willing  to proceed with the planned procedure in am   Delight Ovens MD  Beeper [phone] Office [phone] 05/21/2011 12:45 PM

## 2011-05-21 NOTE — Progress Notes (Signed)
UR Completed. Simmons, Lannis Lichtenwalner F [phone]  

## 2011-05-22 ENCOUNTER — Encounter (HOSPITAL_COMMUNITY)
Admission: EM | Disposition: A | Discharge status: Home / Self Care | Payer: Self-pay | Source: Home / Self Care | Attending: Cardiothoracic Surgery

## 2011-05-22 ENCOUNTER — Encounter (HOSPITAL_COMMUNITY): Payer: Self-pay | Admitting: Anesthesiology

## 2011-05-22 ENCOUNTER — Inpatient Hospital Stay (HOSPITAL_COMMUNITY): Payer: Medicare PPO | Admitting: Anesthesiology

## 2011-05-22 ENCOUNTER — Inpatient Hospital Stay (HOSPITAL_COMMUNITY): Payer: Medicare PPO

## 2011-05-22 DIAGNOSIS — I2510 Atherosclerotic heart disease of native coronary artery without angina pectoris: Secondary | ICD-10-CM

## 2011-05-22 HISTORY — PX: CORONARY ARTERY BYPASS GRAFT: SHX141

## 2011-05-22 LAB — GLUCOSE, CAPILLARY
Glucose-Capillary: 98 mg/dL (ref 70–99)
Glucose-Capillary: 165 mg/dL — ABNORMAL HIGH (ref 70–99)
Glucose-Capillary: 145 mg/dL — ABNORMAL HIGH (ref 70–99)
Glucose-Capillary: 139 mg/dL — ABNORMAL HIGH (ref 70–99)
Glucose-Capillary: 123 mg/dL — ABNORMAL HIGH (ref 70–99)
Glucose-Capillary: 108 mg/dL — ABNORMAL HIGH (ref 70–99)
Glucose-Capillary: 114 mg/dL — ABNORMAL HIGH (ref 70–99)
Glucose-Capillary: 113 mg/dL — ABNORMAL HIGH (ref 70–99)

## 2011-05-22 LAB — POCT I-STAT 3, ART BLOOD GAS (G3+)
Acid-Base Excess: 3 mmol/L — ABNORMAL HIGH (ref 0.0–2.0)
Acid-base deficit: 1 mmol/L (ref 0.0–2.0)
Acid-base deficit: 1 mmol/L (ref 0.0–2.0)
Bicarbonate: 27.1 mEq/L — ABNORMAL HIGH (ref 20.0–24.0)
Bicarbonate: 24.2 mEq/L — ABNORMAL HIGH (ref 20.0–24.0)
Bicarbonate: 25 mEq/L — ABNORMAL HIGH (ref 20.0–24.0)
Bicarbonate: 25 mEq/L — ABNORMAL HIGH (ref 20.0–24.0)
O2 Saturation: 100 %
O2 Saturation: 97 %
O2 Saturation: 98 %
O2 Saturation: 99 %
Patient temperature: 36.6
Patient temperature: 37.3
Patient temperature: 36.7
TCO2: 28 mmol/L (ref 0–100)
TCO2: 25 mmol/L (ref 0–100)
TCO2: 26 mmol/L (ref 0–100)
TCO2: 26 mmol/L (ref 0–100)
pCO2 arterial: 44 mmHg (ref 35.0–45.0)
pCO2 arterial: 41.6 mmHg (ref 35.0–45.0)
pCO2 arterial: 39.5 mmHg (ref 35.0–45.0)
pCO2 arterial: 43.1 mmHg (ref 35.0–45.0)
pCO2 arterial: 43.8 mmHg (ref 35.0–45.0)
pH, Arterial: 7.398 (ref 7.350–7.450)
pH, Arterial: 7.394 (ref 7.350–7.450)
pH, Arterial: 7.372 (ref 7.350–7.450)
pH, Arterial: 7.364 (ref 7.350–7.450)
pO2, Arterial: 86 mmHg (ref 80.0–100.0)
pO2, Arterial: 109 mmHg — ABNORMAL HIGH (ref 80.0–100.0)
pO2, Arterial: 162 mmHg — ABNORMAL HIGH (ref 80.0–100.0)

## 2011-05-22 LAB — POCT I-STAT, CHEM 8
BUN: 8 mg/dL (ref 6–23)
Calcium, Ion: 1.2 mmol/L (ref 1.12–1.32)
Chloride: 111 mEq/L (ref 96–112)
Creatinine, Ser: 0.7 mg/dL (ref 0.50–1.35)
Glucose, Bld: 126 mg/dL — ABNORMAL HIGH (ref 70–99)
HCT: 33 % — ABNORMAL LOW (ref 39.0–52.0)
Hemoglobin: 11.2 g/dL — ABNORMAL LOW (ref 13.0–17.0)
Potassium: 3.7 mEq/L (ref 3.5–5.1)
Sodium: 144 mEq/L (ref 135–145)
TCO2: 23 mmol/L (ref 0–100)

## 2011-05-22 LAB — HEMOGLOBIN AND HEMATOCRIT, BLOOD
HCT: 28.1 % — ABNORMAL LOW (ref 39.0–52.0)
Hemoglobin: 9.7 g/dL — ABNORMAL LOW (ref 13.0–17.0)

## 2011-05-22 LAB — POCT I-STAT 4, (NA,K, GLUC, HGB,HCT)
Glucose, Bld: 90 mg/dL (ref 70–99)
Glucose, Bld: 96 mg/dL (ref 70–99)
Glucose, Bld: 86 mg/dL (ref 70–99)
Glucose, Bld: 184 mg/dL — ABNORMAL HIGH (ref 70–99)
HCT: 32 % — ABNORMAL LOW (ref 39.0–52.0)
HCT: 28 % — ABNORMAL LOW (ref 39.0–52.0)
HCT: 31 % — ABNORMAL LOW (ref 39.0–52.0)
HCT: 33 % — ABNORMAL LOW (ref 39.0–52.0)
Hemoglobin: 10.9 g/dL — ABNORMAL LOW (ref 13.0–17.0)
Hemoglobin: 10.5 g/dL — ABNORMAL LOW (ref 13.0–17.0)
Hemoglobin: 11.2 g/dL — ABNORMAL LOW (ref 13.0–17.0)
Potassium: 4.4 mEq/L (ref 3.5–5.1)
Potassium: 4 mEq/L (ref 3.5–5.1)
Sodium: 141 mEq/L (ref 135–145)
Sodium: 144 mEq/L (ref 135–145)
Sodium: 139 mEq/L (ref 135–145)
Sodium: 141 mEq/L (ref 135–145)

## 2011-05-22 LAB — CREATININE, SERUM
Creatinine, Ser: 0.68 mg/dL (ref 0.50–1.35)
GFR calc Af Amer: >90 mL/min (ref >90)
GFR calc non Af Amer: >90 mL/min (ref >90)

## 2011-05-22 LAB — APTT: aPTT: 34 seconds (ref 24–37)

## 2011-05-22 LAB — HEPATIC FUNCTION PANEL
ALT: 15 U/L (ref 0–53)
AST: 40 U/L — ABNORMAL HIGH (ref 0–37)
Albumin: 2.4 g/dL — ABNORMAL LOW (ref 3.5–5.2)
Alkaline Phosphatase: 79 U/L (ref 39–117)
Bilirubin, Direct: 0.1 mg/dL (ref 0.0–0.3)
Indirect Bilirubin: 0.2 mg/dL — ABNORMAL LOW (ref 0.3–0.9)
Total Bilirubin: 0.3 mg/dL (ref 0.3–1.2)
Total Protein: 4.5 g/dL — ABNORMAL LOW (ref 6.0–8.3)

## 2011-05-22 LAB — MAGNESIUM
Magnesium: 2.5 mg/dL (ref 1.5–2.5)
Magnesium: 2.5 mg/dL (ref 1.5–2.5)

## 2011-05-22 LAB — PLATELET COUNT: Platelets: 169 10*3/uL (ref 150–400)

## 2011-05-22 LAB — CBC
HCT: 33.8 % — ABNORMAL LOW (ref 39.0–52.0)
Hemoglobin: 11.6 g/dL — ABNORMAL LOW (ref 13.0–17.0)
MCH: 29.3 pg (ref 26.0–34.0)
MCHC: 34.1 g/dL (ref 30.0–36.0)
MCHC: 34.3 g/dL (ref 30.0–36.0)
MCV: 85.4 fL (ref 78.0–100.0)
Platelets: 141 10*3/uL — ABNORMAL LOW (ref 150–400)
RBC: 3.96 MIL/uL — ABNORMAL LOW (ref 4.22–5.81)
RDW: 14.3 % (ref 11.5–15.5)
RDW: 14.3 % (ref 11.5–15.5)
WBC: 13 10*3/uL — ABNORMAL HIGH (ref 4.0–10.5)
WBC: 14.1 10*3/uL — ABNORMAL HIGH (ref 4.0–10.5)

## 2011-05-22 LAB — PROTIME-INR
INR: 1.41 (ref 0.00–1.49)
Prothrombin Time: 17.5 seconds — ABNORMAL HIGH (ref 11.6–15.2)

## 2011-05-22 SURGERY — CORONARY ARTERY BYPASS GRAFTING (CABG)
Anesthesia: General | Site: Chest | Wound class: Clean

## 2011-05-22 MED ORDER — MIDAZOLAM HCL 5 MG/5ML IJ SOLN
INTRAMUSCULAR | Status: DC | PRN
  Administered 2011-05-22: 2 mg via INTRAVENOUS
  Administered 2011-05-22: 4 mg via INTRAVENOUS
  Administered 2011-05-22: 6 mg via INTRAVENOUS
  Administered 2011-05-22 (×2): 3 mg via INTRAVENOUS
  Administered 2011-05-22: 2 mg via INTRAVENOUS

## 2011-05-22 MED ORDER — BIOTENE DRY MOUTH MT LIQD
15.0000 mL | Freq: Two times a day (BID) | OROMUCOSAL | Status: DC
  Administered 2011-05-22 – 2011-05-24 (×4): 15 mL via OROMUCOSAL

## 2011-05-22 MED ORDER — ONDANSETRON HCL 4 MG/2ML IJ SOLN
4.0000 mg | Freq: Four times a day (QID) | INTRAMUSCULAR | Status: DC | PRN
  Administered 2011-05-22 – 2011-05-24 (×3): 4 mg via INTRAVENOUS
  Filled 2011-05-22 (×3): qty 2

## 2011-05-22 MED ORDER — SODIUM CHLORIDE 0.9 % IJ SOLN
3.0000 mL | Freq: Two times a day (BID) | INTRAMUSCULAR | Status: DC
  Administered 2011-05-23 (×2): 3 mL via INTRAVENOUS

## 2011-05-22 MED ORDER — SODIUM CHLORIDE 0.9 % IV SOLN: INTRAVENOUS | Status: DC

## 2011-05-22 MED ORDER — MIDAZOLAM HCL 2 MG/2ML IJ SOLN: 2.0000 mg | INTRAMUSCULAR | Status: DC | PRN

## 2011-05-22 MED ORDER — PANTOPRAZOLE SODIUM 40 MG PO TBEC
40.0000 mg | DELAYED_RELEASE_TABLET | Freq: Every day | ORAL | Status: DC
Start: 2011-05-24 — End: 2011-05-24

## 2011-05-22 MED ORDER — SODIUM CHLORIDE 0.9 % IJ SOLN: 3.0000 mL | INTRAMUSCULAR | Status: DC | PRN

## 2011-05-22 MED ORDER — METOPROLOL TARTRATE 1 MG/ML IV SOLN: 2.5000 mg | INTRAVENOUS | Status: DC | PRN

## 2011-05-22 MED ORDER — ACETAMINOPHEN 650 MG RE SUPP
650.0000 mg | RECTAL | Status: AC
  Administered 2011-05-22: 650 mg via RECTAL

## 2011-05-22 MED ORDER — PROTAMINE SULFATE 10 MG/ML IV SOLN
INTRAVENOUS | Status: DC | PRN
  Administered 2011-05-22: 150 mg via INTRAVENOUS
  Administered 2011-05-22: 20 mg via INTRAVENOUS
  Administered 2011-05-22: 130 mg via INTRAVENOUS

## 2011-05-22 MED ORDER — POTASSIUM CHLORIDE 10 MEQ/50ML IV SOLN
10.0000 meq | INTRAVENOUS | Status: AC | PRN
  Administered 2011-05-22 – 2011-05-23 (×3): 10 meq via INTRAVENOUS
  Filled 2011-05-22: qty 150

## 2011-05-22 MED ORDER — MORPHINE SULFATE 2 MG/ML IJ SOLN
1.0000 mg | INTRAMUSCULAR | Status: AC | PRN
  Administered 2011-05-23: 2 mg via INTRAVENOUS
  Filled 2011-05-22: qty 1

## 2011-05-22 MED ORDER — ZOLPIDEM TARTRATE 5 MG PO TABS
5.0000 mg | ORAL_TABLET | Freq: Once | ORAL | Status: AC
  Administered 2011-05-22: 5 mg via ORAL
  Filled 2011-05-22: qty 1

## 2011-05-22 MED ORDER — LACTATED RINGERS IV SOLN
INTRAVENOUS | Status: DC
  Administered 2011-05-23: 20 mL/h via INTRAVENOUS

## 2011-05-22 MED ORDER — VECURONIUM BROMIDE 10 MG IV SOLR
INTRAVENOUS | Status: DC | PRN
  Administered 2011-05-22 (×2): 5 mg via INTRAVENOUS

## 2011-05-22 MED ORDER — ACETAMINOPHEN 160 MG/5ML PO SOLN: 650.0000 mg | ORAL | Status: AC

## 2011-05-22 MED ORDER — BISACODYL 10 MG RE SUPP: 10.0000 mg | Freq: Every day | RECTAL | Status: DC

## 2011-05-22 MED ORDER — VANCOMYCIN HCL 1000 MG IV SOLR
1000.0000 mg | Freq: Once | INTRAVENOUS | Status: AC
  Administered 2011-05-22: 1000 mg via INTRAVENOUS
  Filled 2011-05-22: qty 1000

## 2011-05-22 MED ORDER — TRAMADOL HCL 50 MG PO TABS
50.0000 mg | ORAL_TABLET | ORAL | Status: DC | PRN
  Administered 2011-05-24: 100 mg via ORAL
  Filled 2011-05-22: qty 2

## 2011-05-22 MED ORDER — FAMOTIDINE IN NACL 20-0.9 MG/50ML-% IV SOLN
20.0000 mg | Freq: Two times a day (BID) | INTRAVENOUS | Status: AC
  Administered 2011-05-22: 20 mg via INTRAVENOUS

## 2011-05-22 MED ORDER — SODIUM CHLORIDE 0.9 % IV SOLN: 50.0000 ug/h | INTRAVENOUS | Status: DC

## 2011-05-22 MED ORDER — FENTANYL CITRATE 0.05 MG/ML IJ SOLN
INTRAMUSCULAR | Status: DC | PRN
  Administered 2011-05-22: 200 ug via INTRAVENOUS
  Administered 2011-05-22: 250 ug via INTRAVENOUS
  Administered 2011-05-22: 150 ug via INTRAVENOUS
  Administered 2011-05-22: 350 ug via INTRAVENOUS
  Administered 2011-05-22: 800 ug via INTRAVENOUS
  Administered 2011-05-22: 150 ug via INTRAVENOUS
  Administered 2011-05-22: 100 ug via INTRAVENOUS
  Administered 2011-05-22: 50 ug via INTRAVENOUS
  Administered 2011-05-22: 100 ug via INTRAVENOUS
  Administered 2011-05-22: 250 ug via INTRAVENOUS
  Administered 2011-05-22: 100 ug via INTRAVENOUS

## 2011-05-22 MED ORDER — BISACODYL 5 MG PO TBEC
10.0000 mg | DELAYED_RELEASE_TABLET | Freq: Every day | ORAL | Status: DC
  Administered 2011-05-23: 10 mg via ORAL
  Filled 2011-05-22: qty 2

## 2011-05-22 MED ORDER — ACETAMINOPHEN 160 MG/5ML PO SOLN
975.0000 mg | Freq: Four times a day (QID) | ORAL | Status: DC
  Filled 2011-05-22: qty 40.6

## 2011-05-22 MED ORDER — HEMOSTATIC AGENTS (NO CHARGE) OPTIME
TOPICAL | Status: DC | PRN
  Administered 2011-05-22: 2 via TOPICAL

## 2011-05-22 MED ORDER — AMIODARONE HCL IN DEXTROSE 360-4.14 MG/200ML-% IV SOLN
0.5000 mg/min | INTRAVENOUS | Status: DC
  Administered 2011-05-23: 0.5 mg/min via INTRAVENOUS
  Filled 2011-05-22 (×3): qty 200

## 2011-05-22 MED ORDER — MIDAZOLAM BOLUS VIA INFUSION
1.0000 mg | INTRAVENOUS | Status: DC | PRN
  Filled 2011-05-22: qty 2

## 2011-05-22 MED ORDER — SODIUM CHLORIDE 0.9 % IV SOLN: 2.0000 mg/h | INTRAVENOUS | Status: DC

## 2011-05-22 MED ORDER — SODIUM CHLORIDE 0.9 % IV SOLN
INTRAVENOUS | Status: DC
  Administered 2011-05-22: 2.7 [IU]/h via INTRAVENOUS
  Administered 2011-05-23: 3.2 [IU]/h via INTRAVENOUS
  Administered 2011-05-23: 2.7 [IU]/h via INTRAVENOUS
  Administered 2011-05-23: 3.6 [IU]/h via INTRAVENOUS
  Administered 2011-05-23: 2.9 [IU]/h via INTRAVENOUS
  Filled 2011-05-22 (×2): qty 1

## 2011-05-22 MED ORDER — AMIODARONE HCL IN DEXTROSE 360-4.14 MG/200ML-% IV SOLN
INTRAVENOUS | Status: DC | PRN
  Administered 2011-05-22: 1 mg/min via INTRAVENOUS

## 2011-05-22 MED ORDER — MORPHINE SULFATE 4 MG/ML IJ SOLN
2.0000 mg | INTRAMUSCULAR | Status: DC | PRN
  Filled 2011-05-22: qty 1

## 2011-05-22 MED ORDER — DEXTROSE 5 % IV SOLN: 0.0000 ug/min | INTRAVENOUS | Status: DC

## 2011-05-22 MED ORDER — SODIUM CHLORIDE 0.45 % IV SOLN
INTRAVENOUS | Status: DC
  Administered 2011-05-22: 15:00:00 via INTRAVENOUS

## 2011-05-22 MED ORDER — ALBUMIN HUMAN 5 % IV SOLN: 250.0000 mL | INTRAVENOUS | Status: AC | PRN

## 2011-05-22 MED ORDER — SODIUM CHLORIDE 0.9 % IV SOLN
10.0000 mg | INTRAVENOUS | Status: DC | PRN
  Administered 2011-05-22: 20 ug/min via INTRAVENOUS

## 2011-05-22 MED ORDER — NITROGLYCERIN IN D5W 200-5 MCG/ML-% IV SOLN: 0.0000 ug/min | INTRAVENOUS | Status: DC

## 2011-05-22 MED ORDER — MAGNESIUM SULFATE 40 MG/ML IJ SOLN
INTRAMUSCULAR | Status: AC
  Filled 2011-05-22: qty 100

## 2011-05-22 MED ORDER — ASPIRIN 81 MG PO CHEW: 324.0000 mg | CHEWABLE_TABLET | Freq: Every day | ORAL | Status: DC

## 2011-05-22 MED ORDER — 0.9 % SODIUM CHLORIDE (POUR BTL) OPTIME
TOPICAL | Status: DC | PRN
  Administered 2011-05-22: 6000 mL

## 2011-05-22 MED ORDER — POTASSIUM CHLORIDE 10 MEQ/50ML IV SOLN: 10.0000 meq | INTRAVENOUS | Status: AC

## 2011-05-22 MED ORDER — ROCURONIUM BROMIDE 100 MG/10ML IV SOLN
INTRAVENOUS | Status: DC | PRN
  Administered 2011-05-22 (×3): 50 mg via INTRAVENOUS

## 2011-05-22 MED ORDER — HETASTARCH-ELECTROLYTES 6 % IV SOLN
INTRAVENOUS | Status: DC | PRN
  Administered 2011-05-22: 13:00:00 via INTRAVENOUS

## 2011-05-22 MED ORDER — HEPARIN SODIUM (PORCINE) 1000 UNIT/ML IJ SOLN
INTRAMUSCULAR | Status: DC | PRN
  Administered 2011-05-22: 35000 [IU] via INTRAVENOUS

## 2011-05-22 MED ORDER — VITAMIN B-1 100 MG PO TABS
100.0000 mg | ORAL_TABLET | Freq: Every day | ORAL | Status: DC
  Administered 2011-05-23 – 2011-05-27 (×5): 100 mg via ORAL
  Filled 2011-05-22 (×5): qty 1

## 2011-05-22 MED ORDER — SODIUM CHLORIDE 0.9 % IV SOLN
0.1000 ug/kg/h | INTRAVENOUS | Status: DC
  Filled 2011-05-22: qty 2

## 2011-05-22 MED ORDER — ASPIRIN EC 325 MG PO TBEC
325.0000 mg | DELAYED_RELEASE_TABLET | Freq: Every day | ORAL | Status: DC
  Administered 2011-05-23: 325 mg via ORAL
  Filled 2011-05-22 (×2): qty 1

## 2011-05-22 MED ORDER — HEMOSTATIC AGENTS (NO CHARGE) OPTIME
TOPICAL | Status: DC | PRN
  Administered 2011-05-22: 1 via TOPICAL

## 2011-05-22 MED ORDER — FENTANYL BOLUS VIA INFUSION: 50.0000 ug | Freq: Four times a day (QID) | INTRAVENOUS | Status: DC | PRN

## 2011-05-22 MED ORDER — ADULT MULTIVITAMIN W/MINERALS CH
1.0000 | ORAL_TABLET | Freq: Every day | ORAL | Status: DC
  Administered 2011-05-23: 1 via ORAL
  Filled 2011-05-22 (×2): qty 1

## 2011-05-22 MED ORDER — SODIUM CHLORIDE 0.9 % IV SOLN
250.0000 mL | INTRAVENOUS | Status: DC
  Administered 2011-05-23: 250 mL via INTRAVENOUS

## 2011-05-22 MED ORDER — DOCUSATE SODIUM 100 MG PO CAPS
200.0000 mg | ORAL_CAPSULE | Freq: Every day | ORAL | Status: DC
  Administered 2011-05-23: 200 mg via ORAL
  Filled 2011-05-22: qty 2

## 2011-05-22 MED ORDER — AMIODARONE HCL IN DEXTROSE 360-4.14 MG/200ML-% IV SOLN
60.0000 mg/h | INTRAVENOUS | Status: AC
  Administered 2011-05-22: 33.3333 mL/h via INTRAVENOUS
  Filled 2011-05-22: qty 200

## 2011-05-22 MED ORDER — LACTATED RINGERS IV SOLN: 500.0000 mL | Freq: Once | INTRAVENOUS | Status: AC | PRN

## 2011-05-22 MED ORDER — DEXTROSE 5 % IV SOLN
1.5000 g | Freq: Two times a day (BID) | INTRAVENOUS | Status: DC
  Administered 2011-05-22 – 2011-05-23 (×3): 1.5 g via INTRAVENOUS
  Filled 2011-05-22 (×4): qty 1.5

## 2011-05-22 MED ORDER — METOPROLOL TARTRATE 25 MG/10 ML ORAL SUSPENSION
12.5000 mg | Freq: Two times a day (BID) | ORAL | Status: DC
  Administered 2011-05-22: 12.5 mg
  Filled 2011-05-22 (×7): qty 5

## 2011-05-22 MED ORDER — INSULIN REGULAR BOLUS VIA INFUSION
0.0000 [IU] | Freq: Three times a day (TID) | INTRAVENOUS | Status: DC
  Administered 2011-05-22 – 2011-05-23 (×3): 0 [IU] via INTRAVENOUS
  Filled 2011-05-22: qty 10

## 2011-05-22 MED ORDER — ACETAMINOPHEN 500 MG PO TABS
1000.0000 mg | ORAL_TABLET | Freq: Four times a day (QID) | ORAL | Status: DC
  Administered 2011-05-23 – 2011-05-24 (×6): 1000 mg via ORAL
  Filled 2011-05-22 (×10): qty 2

## 2011-05-22 MED ORDER — AMIODARONE HCL IN DEXTROSE 360-4.14 MG/200ML-% IV SOLN
1.0000 mg/min | INTRAVENOUS | Status: AC
  Filled 2011-05-22 (×2): qty 200

## 2011-05-22 MED ORDER — METOPROLOL TARTRATE 12.5 MG HALF TABLET
12.5000 mg | ORAL_TABLET | Freq: Two times a day (BID) | ORAL | Status: DC
  Administered 2011-05-23 – 2011-05-24 (×4): 12.5 mg via ORAL
  Filled 2011-05-22 (×7): qty 1

## 2011-05-22 MED ORDER — MAGNESIUM SULFATE 40 MG/ML IJ SOLN
4.0000 g | Freq: Once | INTRAMUSCULAR | Status: AC
  Administered 2011-05-22: 4 g via INTRAVENOUS

## 2011-05-22 MED ORDER — PROPOFOL 10 MG/ML IV EMUL
INTRAVENOUS | Status: DC | PRN
  Administered 2011-05-22: 100 mg via INTRAVENOUS

## 2011-05-22 SURGICAL SUPPLY — 124 items
ATTRACTOMAT 16X20 MAGNETIC DRP (DRAPES) ×2 IMPLANT
BAG DECANTER FOR FLEXI CONT (MISCELLANEOUS) ×2 IMPLANT
BANDAGE ELASTIC 4 VELCRO ST LF (GAUZE/BANDAGES/DRESSINGS) ×2 IMPLANT
BANDAGE ELASTIC 6 VELCRO ST LF (GAUZE/BANDAGES/DRESSINGS) ×2 IMPLANT
BANDAGE GAUZE ELAST BULKY 4 IN (GAUZE/BANDAGES/DRESSINGS) ×2 IMPLANT
BLADE SAW STERNAL (BLADE) ×2 IMPLANT
BLADE SURG 11 STRL SS (BLADE) ×2 IMPLANT
BLADE SURG ROTATE 9660 (MISCELLANEOUS) IMPLANT
CANISTER SUCTION 2500CC (MISCELLANEOUS) ×2 IMPLANT
CANN PRFSN .5XCNCT 15X34-48 (MISCELLANEOUS) ×1
CANNULA AORTIC HI-FLOW 6.5M20F (CANNULA) ×2 IMPLANT
CANNULA PRFSN .5XCNCT 15X34-48 (MISCELLANEOUS) ×1 IMPLANT
CANNULA VEN 2 STAGE (MISCELLANEOUS) ×1
CATH CPB KIT GERHARDT (MISCELLANEOUS) ×2 IMPLANT
CATH HEART VENT LEFT (CATHETERS) ×1 IMPLANT
CATH THORACIC 28FR (CATHETERS) ×2 IMPLANT
CATH THORACIC 28FR RT ANG (CATHETERS) IMPLANT
CATH THORACIC 36FR (CATHETERS) IMPLANT
CATH THORACIC 36FR RT ANG (CATHETERS) IMPLANT
CLIP FOGARTY SPRING 6M (CLIP) ×2 IMPLANT
CLIP TI MEDIUM 24 (CLIP) IMPLANT
CLIP TI WIDE RED SMALL 24 (CLIP) IMPLANT
CLOTH BEACON ORANGE TIMEOUT ST (SAFETY) ×2 IMPLANT
CONT SPEC 4OZ CLIKSEAL STRL BL (MISCELLANEOUS) ×2 IMPLANT
COVER SURGICAL LIGHT HANDLE (MISCELLANEOUS) ×4 IMPLANT
CRADLE DONUT ADULT HEAD (MISCELLANEOUS) ×2 IMPLANT
DERMABOND ADVANCED (GAUZE/BANDAGES/DRESSINGS) ×1
DERMABOND ADVANCED .7 DNX12 (GAUZE/BANDAGES/DRESSINGS) ×1 IMPLANT
DRAIN CHANNEL 28F RND 3/8 FF (WOUND CARE) ×2 IMPLANT
DRAIN CHANNEL 32F RND 10.7 FF (WOUND CARE) IMPLANT
DRAPE CARDIOVASCULAR INCISE (DRAPES) ×1
DRAPE SLUSH MACHINE 52X66 (DRAPES) IMPLANT
DRAPE SLUSH/WARMER DISC (DRAPES) IMPLANT
DRAPE SRG 135X102X78XABS (DRAPES) ×1 IMPLANT
DRSG COVADERM 4X14 (GAUZE/BANDAGES/DRESSINGS) ×2 IMPLANT
ELECT BLADE 4.0 EZ CLEAN MEGAD (MISCELLANEOUS) ×2
ELECT CAUTERY BLADE 6.4 (BLADE) ×2 IMPLANT
ELECT REM PT RETURN 9FT ADLT (ELECTROSURGICAL) ×4
ELECTRODE BLDE 4.0 EZ CLN MEGD (MISCELLANEOUS) ×1 IMPLANT
ELECTRODE REM PT RTRN 9FT ADLT (ELECTROSURGICAL) ×2 IMPLANT
GLOVE BIO SURGEON STRL SZ 6 (GLOVE) ×4 IMPLANT
GLOVE BIO SURGEON STRL SZ 6.5 (GLOVE) ×10 IMPLANT
GLOVE BIO SURGEON STRL SZ7 (GLOVE) ×6 IMPLANT
GLOVE BIO SURGEON STRL SZ7.5 (GLOVE) IMPLANT
GLOVE BIOGEL PI IND STRL 6 (GLOVE) ×3 IMPLANT
GLOVE BIOGEL PI IND STRL 6.5 (GLOVE) ×7 IMPLANT
GLOVE BIOGEL PI IND STRL 7.0 (GLOVE) ×3 IMPLANT
GLOVE BIOGEL PI INDICATOR 6 (GLOVE) ×3
GLOVE BIOGEL PI INDICATOR 6.5 (GLOVE) ×7
GLOVE BIOGEL PI INDICATOR 7.0 (GLOVE) ×3
GLOVE EUDERMIC 7 POWDERFREE (GLOVE) IMPLANT
GLOVE ORTHO TXT STRL SZ7.5 (GLOVE) IMPLANT
GLOVE SS BIOGEL STRL SZ 7 (GLOVE) ×4 IMPLANT
GLOVE SUPERSENSE BIOGEL SZ 7 (GLOVE) ×4
GOWN STRL NON-REIN LRG LVL3 (GOWN DISPOSABLE) ×18 IMPLANT
GOWN STRL REIN XL XLG (GOWN DISPOSABLE) ×4 IMPLANT
HEMOSTAT POWDER SURGIFOAM 1G (HEMOSTASIS) ×4 IMPLANT
HEMOSTAT SURGICEL 2X14 (HEMOSTASIS) ×2 IMPLANT
INSERT FOGARTY 61MM (MISCELLANEOUS) IMPLANT
INSERT FOGARTY XLG (MISCELLANEOUS) IMPLANT
KIT BASIN OR (CUSTOM PROCEDURE TRAY) ×2 IMPLANT
KIT ROOM TURNOVER OR (KITS) ×2 IMPLANT
KIT SUCTION CATH 14FR (SUCTIONS) ×6 IMPLANT
KIT VASOVIEW W/TROCAR VH 2000 (KITS) ×2 IMPLANT
LEAD PACING MYOCARDI (MISCELLANEOUS) ×2 IMPLANT
MARKER GRAFT CORONARY BYPASS (MISCELLANEOUS) ×6 IMPLANT
NS IRRIG 1000ML POUR BTL (IV SOLUTION) ×10 IMPLANT
PACK OPEN HEART (CUSTOM PROCEDURE TRAY) ×2 IMPLANT
PAD ARMBOARD 7.5X6 YLW CONV (MISCELLANEOUS) ×4 IMPLANT
PENCIL BUTTON HOLSTER BLD 10FT (ELECTRODE) ×2 IMPLANT
PUNCH AORTIC ROTATE 4.0MM (MISCELLANEOUS) ×2 IMPLANT
PUNCH AORTIC ROTATE 4.5MM 8IN (MISCELLANEOUS) IMPLANT
PUNCH AORTIC ROTATE 5MM 8IN (MISCELLANEOUS) IMPLANT
SET CARDIOPLEGIA MPS 5001102 (MISCELLANEOUS) ×2 IMPLANT
SOLUTION ANTI FOG 6CC (MISCELLANEOUS) IMPLANT
SPONGE GAUZE 4X4 12PLY (GAUZE/BANDAGES/DRESSINGS) ×4 IMPLANT
SPONGE LAP 18X18 X RAY DECT (DISPOSABLE) ×4 IMPLANT
SPONGE LAP 4X18 X RAY DECT (DISPOSABLE) IMPLANT
SUT BONE WAX W31G (SUTURE) IMPLANT
SUT MNCRL AB 4-0 PS2 18 (SUTURE) ×2 IMPLANT
SUT PROLENE 3 0 RB 1 (SUTURE) ×2 IMPLANT
SUT PROLENE 3 0 SH 1 (SUTURE) ×2 IMPLANT
SUT PROLENE 3 0 SH DA (SUTURE) IMPLANT
SUT PROLENE 3 0 SH1 36 (SUTURE) ×2 IMPLANT
SUT PROLENE 4 0 RB 1 (SUTURE) ×1
SUT PROLENE 4 0 SH DA (SUTURE) IMPLANT
SUT PROLENE 4 0 TF (SUTURE) ×4 IMPLANT
SUT PROLENE 4-0 RB1 .5 CRCL 36 (SUTURE) ×1 IMPLANT
SUT PROLENE 5 0 C 1 36 (SUTURE) IMPLANT
SUT PROLENE 6 0 C 1 30 (SUTURE) IMPLANT
SUT PROLENE 6 0 CC (SUTURE) ×10 IMPLANT
SUT PROLENE 7 0 BV 1 (SUTURE) IMPLANT
SUT PROLENE 7 0 BV1 MDA (SUTURE) ×6 IMPLANT
SUT PROLENE 7.0 RB 3 (SUTURE) ×2 IMPLANT
SUT PROLENE 8 0 BV175 6 (SUTURE) ×8 IMPLANT
SUT SILK  1 MH (SUTURE)
SUT SILK 1 MH (SUTURE) IMPLANT
SUT SILK 2 0 SH CR/8 (SUTURE) IMPLANT
SUT SILK 3 0 SH CR/8 (SUTURE) IMPLANT
SUT STEEL 6MS V (SUTURE) ×2 IMPLANT
SUT STEEL STERNAL CCS#1 18IN (SUTURE) IMPLANT
SUT STEEL SZ 6 DBL 3X14 BALL (SUTURE) ×2 IMPLANT
SUT VIC AB 1 CTX 18 (SUTURE) ×4 IMPLANT
SUT VIC AB 1 CTX 36 (SUTURE)
SUT VIC AB 1 CTX36XBRD ANBCTR (SUTURE) IMPLANT
SUT VIC AB 2-0 CT1 27 (SUTURE) ×1
SUT VIC AB 2-0 CT1 TAPERPNT 27 (SUTURE) ×1 IMPLANT
SUT VIC AB 2-0 CTX 27 (SUTURE) IMPLANT
SUT VIC AB 3-0 SH 27 (SUTURE)
SUT VIC AB 3-0 SH 27X BRD (SUTURE) IMPLANT
SUT VIC AB 3-0 X1 27 (SUTURE) IMPLANT
SUT VICRYL 4-0 PS2 18IN ABS (SUTURE) IMPLANT
SUTURE E-PAK OPEN HEART (SUTURE) ×2 IMPLANT
SYSTEM SAHARA CHEST DRAIN ATS (WOUND CARE) ×2 IMPLANT
TAPE CLOTH SURG 4X10 WHT LF (GAUZE/BANDAGES/DRESSINGS) ×2 IMPLANT
TOWEL OR 17X24 6PK STRL BLUE (TOWEL DISPOSABLE) ×4 IMPLANT
TOWEL OR 17X26 10 PK STRL BLUE (TOWEL DISPOSABLE) ×4 IMPLANT
TRAY FOLEY IC TEMP SENS 14FR (CATHETERS) ×2 IMPLANT
TUBE FEEDING 8FR 16IN STR KANG (MISCELLANEOUS) ×2 IMPLANT
TUBE SUCT INTRACARD DLP 20F (MISCELLANEOUS) ×2 IMPLANT
TUBING INSUFFLATION 10FT LAP (TUBING) ×2 IMPLANT
UNDERPAD 30X30 INCONTINENT (UNDERPADS AND DIAPERS) ×2 IMPLANT
VENT LEFT HEART 12002 (CATHETERS) ×2
WATER STERILE IRR 1000ML POUR (IV SOLUTION) ×4 IMPLANT

## 2011-05-22 NOTE — Progress Notes (Signed)
Pt. Had hypoglycemic episode bs was 63, pt. Symptomatic with some shakiness, given orange juice and crackers with peanutbutter, refused oral glycemic gel, blood sugar rechecked after 15-20 mins bloood sugar was 109. Dr. Lurline Idol was notified and made aware. Will cont to monitor pt. Blood sugar.

## 2011-05-22 NOTE — Progress Notes (Signed)
ANTICOAGULATION CONSULT NOTE - Follow Up Consult  Pharmacy Consult for heparin Indication: CAD awaiting CABG  Labs:  Basename 05/22/11 0001 05/21/11 0555 05/20/11 2004 05/20/11 0252 05/19/11 2036 05/19/11 1452 05/19/11 1451  HGB -- 13.2 -- 13.7 -- -- --  HCT -- 37.9* -- 39.5 -- 40.5 --  PLT -- 205 -- 235 -- 211 --  APTT -- -- -- -- -- 76* --  LABPROT -- 13.8 -- -- -- 14.2 --  INR -- 1.04 -- -- -- 1.08 --  HEPARINUNFRC 0.39 0.64 0.28* -- -- -- --  CREATININE -- 0.71 -- 0.73 -- 0.68 --  CKTOTAL -- -- -- 224 266* -- 243*  CKMB -- -- -- 14.6* 19.0* -- 17.5*  TROPONINI -- -- -- 4.51* 4.71* -- 2.75*   Assessment: 71yo male remains therapeutic on heparin while awaiting CABG, first case this am.  Will continue heparin at current rate until off for OR.  Colleen Can PharmD BCPS 05/22/2011,2:27 AM

## 2011-05-22 NOTE — Progress Notes (Signed)
Patient status post multivessel CABG today. Currently extubated with stable hemodynamics. Patient atrially paced on IV amiodarone with stable rhythm.

## 2011-05-22 NOTE — Brief Op Note (Signed)
05/19/2011 - 05/22/2011  11:59 AM  PATIENT:  Larry Cooper  71 y.o. 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 Left Lymph node  DISPOSITION OF SPECIMEN:  PATHOLOGY.Frozen section of the left lymph node showed NO malignancy.  COUNTS CORRECT:  YES  DICTATION:  Dictation  PLAN OF CARE: Admit to inpatient   PATIENT DISPOSITION:  ICU - intubated and hemodynamically stable.   Delay start of Pharmacological VTE agent (>24hrs) due to surgical blood loss or risk of bleeding: yes  PRE OP WEIGHT: 87 kg

## 2011-05-22 NOTE — Transfer of Care (Signed)
Immediate Anesthesia Transfer of Care Note  Patient: Larry Cooper  Procedure(s) Performed: Procedure(s) (LRB): CORONARY ARTERY BYPASS GRAFTING (CABG) (N/A)  Patient Location: SICU  Anesthesia Type: General  Level of Consciousness: sedated and unresponsive  Airway & Oxygen Therapy: Patient remains intubated per anesthesia plan  Post-op Assessment: Report given to PACU RN and Post -op Vital signs reviewed and stable  Post vital signs: Reviewed and stable  Complications: No apparent anesthesia complications

## 2011-05-22 NOTE — Procedures (Signed)
Extubation Procedure Note  Patient Details:   Name: Larry Cooper DOB: 12-13-1940 MRN: [id number]   Airway Documentation:  Airway 8 mm (Active)  Secured at (cm) 22 cm 05/22/2011  6:00 PM  Measured From Lips 05/22/2011  6:00 PM  Secured By Pink Tape 05/22/2011  6:00 PM  Site Condition Dry 05/22/2011  6:00 PM    Evaluation  O2 sats: stable throughout Complications: No apparent complications Patient did tolerate procedure well. Bilateral Breath Sounds: Diminished   Yes  Shelia Media [facility] 05/22/2011, 8:09 PM

## 2011-05-22 NOTE — Anesthesia Postprocedure Evaluation (Signed)
  Anesthesia Post-op Note  Patient: Larry Cooper  Procedure(s) Performed: Procedure(s) (LRB): CORONARY ARTERY BYPASS GRAFTING (CABG) (N/A)  Patient Location: SICU  Anesthesia Type: General  Level of Consciousness: Patient on ventilator per anesthesia plan  Airway and Oxygen Therapy: Patient on ventilator Post-op Pain: none  Post-op Assessment: Post-op Vital signs reviewed and Patient's Cardiovascular Status Stable  Post-op Vital Signs: stable  Complications: No apparent anesthesia complications

## 2011-05-22 NOTE — Progress Notes (Signed)
Pt. Has on going heparin drip, clarified with Dr. Lurline Idol this whether needs to be d/c or not prior to sx., no order from anesthesia pre op noted about heparin to be d/c, Dr. Lurline Idol stated will cont. Heparin until the surgeon decided to d/c.

## 2011-05-22 NOTE — Anesthesia Procedure Notes (Signed)
Procedure Name: Intubation Date/Time: 05/22/2011 7:40 AM Performed by: Carmela Rima Pre-anesthesia Checklist: Emergency Drugs available, Patient identified, Timeout performed, Suction available and Patient being monitored Patient Re-evaluated:Patient Re-evaluated prior to inductionOxygen Delivery Method: Circle system utilized Preoxygenation: Pre-oxygenation with 100% oxygen Intubation Type: IV induction Ventilation: Mask ventilation without difficulty Laryngoscope Size: Mac and 4 Grade View: Grade I Tube type: Oral Number of attempts: 1 Placement Confirmation: ETT inserted through vocal cords under direct vision,  positive ETCO2 and breath sounds checked- equal and bilateral Secured at: 23 cm Tube secured with: Tape Dental Injury: Teeth and Oropharynx as per pre-operative assessment

## 2011-05-22 NOTE — Preoperative (Signed)
Beta Blockers   Reason not to administer Beta Blockers:Hold beta blocker due to other 

## 2011-05-22 NOTE — OR Nursing (Signed)
Unit 2300 called at removal of retractor, skin stitch and patient exit. Family called at removal of retractor.

## 2011-05-22 NOTE — Anesthesia Preprocedure Evaluation (Addendum)
Anesthesia Evaluation  Patient identified by MRN, date of birth, ID band Patient awake    Reviewed: Allergy & Precautions, H&P , NPO status , Patient's Chart, lab work & pertinent test results, reviewed documented beta blocker date and time   Airway Mallampati: I TM Distance: >3 FB Neck ROM: full    Dental  (+) Dental Advidsory Given and Poor Dentition   Pulmonary  breath sounds clear to auscultation        Cardiovascular + CAD and + Past MI Rhythm:regular Rate:Normal     Neuro/Psych CVA, Residual Symptoms    GI/Hepatic   Endo/Other  Diabetes mellitus-, Well Controlled, Type 2  Renal/GU      Musculoskeletal   Abdominal   Peds  Hematology   Anesthesia Other Findings   Reproductive/Obstetrics                          Anesthesia Physical Anesthesia Plan  ASA: III  Anesthesia Plan: General   Post-op Pain Management:    Induction: Intravenous  Airway Management Planned: Oral ETT  Additional Equipment: Arterial line, PA Cath, TEE and CVP  Intra-op Plan:   Post-operative Plan: Post-operative intubation/ventilation  Informed Consent: I have reviewed the patients History and Physical, chart, labs and discussed the procedure including the risks, benefits and alternatives for the proposed anesthesia with the patient or authorized representative who has indicated his/her understanding and acceptance.   Dental Advisory Given  Plan Discussed with: Surgeon, CRNA and Anesthesiologist  Anesthesia Plan Comments: (# V CAD with NL LV systolic function Type 2 DM Smoker Htn Poor dentition  Plan GA  [name], MD)       Anesthesia Quick Evaluation

## 2011-05-22 NOTE — Progress Notes (Signed)
Pt. Was sent to OR, pre-op checklist done, daughter arrived and was able to talked to his father.

## 2011-05-23 ENCOUNTER — Inpatient Hospital Stay (HOSPITAL_COMMUNITY): Payer: Medicare PPO

## 2011-05-23 LAB — POCT I-STAT, CHEM 8
BUN: 15 mg/dL (ref 6–23)
Calcium, Ion: 1.27 mmol/L (ref 1.12–1.32)
Chloride: 100 mEq/L (ref 96–112)
Creatinine, Ser: 0.6 mg/dL (ref 0.50–1.35)
Glucose, Bld: 383 mg/dL — ABNORMAL HIGH (ref 70–99)
HCT: 33 % — ABNORMAL LOW (ref 39.0–52.0)
Hemoglobin: 11.2 g/dL — ABNORMAL LOW (ref 13.0–17.0)
Potassium: 4.5 mEq/L (ref 3.5–5.1)
Sodium: 136 mEq/L (ref 135–145)
TCO2: 25 mmol/L (ref 0–100)

## 2011-05-23 LAB — CBC
HCT: 34 % — ABNORMAL LOW (ref 39.0–52.0)
MCH: 29.6 pg (ref 26.0–34.0)
MCH: 28.9 pg (ref 26.0–34.0)
MCHC: 34.1 g/dL (ref 30.0–36.0)
MCV: 86.7 fL (ref 78.0–100.0)
MCV: 86.5 fL (ref 78.0–100.0)
Platelets: 138 10*3/uL — ABNORMAL LOW (ref 150–400)
RBC: 3.94 MIL/uL — ABNORMAL LOW (ref 4.22–5.81)
RDW: 14.8 % (ref 11.5–15.5)
WBC: 12.4 10*3/uL — ABNORMAL HIGH (ref 4.0–10.5)

## 2011-05-23 LAB — GLUCOSE, CAPILLARY
Glucose-Capillary: 102 mg/dL — ABNORMAL HIGH (ref 70–99)
Glucose-Capillary: 105 mg/dL — ABNORMAL HIGH (ref 70–99)
Glucose-Capillary: 109 mg/dL — ABNORMAL HIGH (ref 70–99)
Glucose-Capillary: 113 mg/dL — ABNORMAL HIGH (ref 70–99)
Glucose-Capillary: 118 mg/dL — ABNORMAL HIGH (ref 70–99)
Glucose-Capillary: 123 mg/dL — ABNORMAL HIGH (ref 70–99)
Glucose-Capillary: 124 mg/dL — ABNORMAL HIGH (ref 70–99)
Glucose-Capillary: 130 mg/dL — ABNORMAL HIGH (ref 70–99)
Glucose-Capillary: 131 mg/dL — ABNORMAL HIGH (ref 70–99)
Glucose-Capillary: 132 mg/dL — ABNORMAL HIGH (ref 70–99)
Glucose-Capillary: 140 mg/dL — ABNORMAL HIGH (ref 70–99)
Glucose-Capillary: 141 mg/dL — ABNORMAL HIGH (ref 70–99)
Glucose-Capillary: 242 mg/dL — ABNORMAL HIGH (ref 70–99)
Glucose-Capillary: 300 mg/dL — ABNORMAL HIGH (ref 70–99)
Glucose-Capillary: 345 mg/dL — ABNORMAL HIGH (ref 70–99)
Glucose-Capillary: 92 mg/dL (ref 70–99)

## 2011-05-23 LAB — BASIC METABOLIC PANEL
BUN: 10 mg/dL (ref 6–23)
Calcium: 8.6 mg/dL (ref 8.4–10.5)
Chloride: 110 mEq/L (ref 96–112)
Creatinine, Ser: 0.63 mg/dL (ref 0.50–1.35)
GFR calc Af Amer: >90 mL/min (ref >90)

## 2011-05-23 LAB — TSH: TSH: 2.678 u[IU]/mL (ref 0.350–4.500)

## 2011-05-23 LAB — CK TOTAL AND CKMB (NOT AT ARMC): Relative Index: 5.8 — ABNORMAL HIGH (ref 0.0–2.5)

## 2011-05-23 MED ORDER — AMIODARONE HCL 200 MG PO TABS: 200.0000 mg | ORAL_TABLET | Freq: Every day | ORAL | Status: DC

## 2011-05-23 MED ORDER — INSULIN GLARGINE 100 UNIT/ML ~~LOC~~ SOLN
20.0000 [IU] | SUBCUTANEOUS | Status: DC
  Administered 2011-05-23: 20 [IU] via SUBCUTANEOUS
  Filled 2011-05-23: qty 3

## 2011-05-23 MED ORDER — INSULIN ASPART 100 UNIT/ML ~~LOC~~ SOLN
0.0000 [IU] | SUBCUTANEOUS | Status: DC
  Administered 2011-05-23: 16 [IU] via SUBCUTANEOUS
  Filled 2011-05-23: qty 3

## 2011-05-23 MED ORDER — CLOPIDOGREL BISULFATE 75 MG PO TABS
75.0000 mg | ORAL_TABLET | Freq: Every day | ORAL | Status: DC
  Administered 2011-05-24 – 2011-05-27 (×4): 75 mg via ORAL
  Filled 2011-05-23 (×5): qty 1

## 2011-05-23 MED ORDER — FUROSEMIDE 10 MG/ML IJ SOLN
40.0000 mg | Freq: Once | INTRAMUSCULAR | Status: AC
  Administered 2011-05-23: 40 mg via INTRAVENOUS
  Filled 2011-05-23: qty 4

## 2011-05-23 MED ORDER — AMIODARONE HCL 200 MG PO TABS
200.0000 mg | ORAL_TABLET | Freq: Two times a day (BID) | ORAL | Status: DC
  Administered 2011-05-23 – 2011-05-24 (×4): 200 mg via ORAL
  Filled 2011-05-23 (×6): qty 1

## 2011-05-23 MED FILL — Lactated Ringer's Solution: INTRAVENOUS | Qty: 500 | Status: AC

## 2011-05-23 MED FILL — Verapamil HCl IV Soln 2.5 MG/ML: INTRAVENOUS | Qty: 4 | Status: AC

## 2011-05-23 MED FILL — Potassium Chloride Inj 2 mEq/ML: INTRAVENOUS | Qty: 40 | Status: AC

## 2011-05-23 MED FILL — Magnesium Sulfate Inj 50%: INTRAMUSCULAR | Qty: 10 | Status: AC

## 2011-05-23 MED FILL — Nitroglycerin IV Soln 5 MG/ML: INTRAVENOUS | Qty: 10 | Status: AC

## 2011-05-23 MED FILL — Heparin Sodium (Porcine) Inj 1000 Unit/ML: INTRAMUSCULAR | Qty: 1 | Status: AC

## 2011-05-23 NOTE — Op Note (Signed)
NAME:  Larry Cooper, Larry Cooper NO.:  [id number]  MEDICAL RECORD NO.:  [id number]  LOCATION:  2303                         FACILITY:  [facility]  PHYSICIAN:  Sheliah Plane, MD    DATE OF BIRTH:  1940/11/30  DATE OF PROCEDURE:  05/22/2011 DATE OF DISCHARGE:                              OPERATIVE REPORT   PREOPERATIVE DIAGNOSIS:  Coronary occlusive disease with recent non-ST- segment elevation myocardial infarction.  POSTOPERATIVE DIAGNOSIS:  Coronary occlusive disease with recent non-ST- segment elevation myocardial infarction.  SURGICAL PROCEDURE:  Coronary artery bypass grafting x6 with the left internal mammary to the very distal left anterior descending coronary artery, reverse saphenous vein graft to the first diagonal, sequential reverse saphenous vein graft to the ramus and distal circumflex, reverse saphenous vein graft to the posterior descending and posterior lateral branches of the right coronary artery.  Biopsy of left pleural plaques and incidental left mammary artery chain lymph node with right thigh and leg endo-vein harvesting.  SURGEON:  Sheliah Plane, MD  FIRST ASSISTANT:  Doree Fudge, PA  BRIEF HISTORY:  The patient is a 71 year old male admitted to the emergency room several days prior to surgery, intoxicated with complaining of chest pain.  He has an extensive previous cardiac history with numerous stents and history of myocardial infarction on at least 2 occasions, all treated at [facility].  His troponins were elevated.  He was admitted and seen by Dr. Anne Fu in consultation. Cardiac catheterization revealed severe three-vessel coronary artery disease with significant distal disease, but with bypassable vessels. Overall ventricular function was preserved.  The patient is a heavy smoker and also has poorly controlled diabetes.  Risks and options were discussed with the patient.  With his diabetic state and severe three- vessel  coronary artery disease, further angioplasties were not indicated.  Risks and options were discussed, and the patient agreed with proceeding with coronary artery bypass grafting.  DESCRIPTION OF PROCEDURE:  With Swan-Ganz and arterial line monitors in place, the patient underwent general endotracheal anesthesia without incident.  The skin of the chest and legs was prepped with Betadine and draped in usual sterile manner.  Dr. Noreene Larsson placed a TEE probe with findings dictated under separate note.  Using the [employer]ant endo-vein harvesting system, vein was harvested from the right thigh and calf and was of good quality and caliber.  Median sternotomy was performed.  Left internal mammary artery was dissected down as a pedicle graft.  During this dissection and also on the patient's preop CT scan of the chest, there was evidence of extensive pleural plaques.  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  One of these plaques was excised and the biopsies sent to pathology for examination.  Preoperative CT scan had showed some diffuse adenopathy, borderline.  A slightly enlarged mediastinal lymph node was present during the dissection of the mammary and this was submitted to pathology also with no malignancy identified. The artery was of good quality and length.  Pericardium was opened.  The patient was systemically heparinized.  The ascending aorta was cannulated, the right atrium was cannulated, and aortic root vent cardioplegia needle was introduced into the ascending aorta.  The patient  was placed on cardiopulmonary bypass 2.4 L/min/m2.  Sites of anastomosis were selected and dissected out of the epicardium.  The LAD had 3 or 4 stents in it placed previously, 1 in the distal LAD.  The site of anastomosis was forced to the very apical portion of the LAD. The distal right coronary was severely diseased and calcified and was not suitable to place a bypass in to cover the PD and PL, though  both small and extensively diseased did have areas suitable to open.  The patient's body temperature was cooled to 32 degrees.  Aortic crossclamp was applied.  800 mL of cold blood potassium cardioplegia was administered with diastolic arrest of the heart.  Myocardial septal temperatures monitored throughout the crossclamp.  Attention was turned first to the posterior descending, posterior lateral branches of the right coronary artery.  Posterior descending was a small thin-walled vessel, admitted a 1-mm probe distally.  Using a running 8-0 Prolene, a side-to-side anastomosis was carried out.  Distal extent of the same vein was then anastomosed to the posterior lateral branch of the right coronary artery.  This vessel was also a small vessel, admitting a 1-mm probe proximally and distally.  Running 8-0 Prolene was used for the distal anastomosis.  The heart was then elevated.  In the interim, ramus intermediate vessel was located.  This was a reasonable sized vessel that was primarily intramyocardial.  The vessel was dissected out and using a diamond type side-to-side anastomosis with a running 7-0 Prolene, the distal anastomosis was performed with second reverse saphenous vein graft.  The distal extent of the same vein was then carried to the circumflex coronary artery, which was opened and was slightly larger than the posterior lateral branch, but did admit a 1.5- mm probe.  Using a running 7-0 Prolene, a distal anastomosis was performed.  Additional cold blood cardioplegia was administered down the vein grafts.  Attention was then turned to the diagonal coronary artery. The second diagonal was very diffusely diseased distally and was not thought to be bypassable.  The first diagonal was slightly larger.  The vessel was opened, admitted a 1-mm probe distally.  Using a running 7-0 Prolene, distal anastomosis was performed.  Attention was then turned to the left anterior descending  coronary artery.  There was a long stent in the mid to distal LAD and just distal to the stent, the vessel was opened.  It was 1.3 mm in size distally.  A 1-mm probe would pass proximally.  Using a running 8-0 Prolene, the left internal mammary artery was anastomosed to the left anterior descending coronary artery. With release of the bulldog on the mammary artery, there was rise in myocardial septal temperature.  Bulldog was placed back on the mammary artery.  With crossclamp still in place, 3 punch aortotomies were performed.  Each of the 3 vein grafts were anastomosed to the ascending aorta.  Air was evacuated from the grafts.  The ascending aorta and the aortic crossclamp was removed with a total crossclamp time of 116 minutes.  The patient initially was in heart block, then had several episodes of ventricular fibrillation, required cardioversion 3 or 4 times.  Ultimately, he was loaded with amiodarone and converted to initially a paced rhythm with heart block.  Prior to the end of the case, he resumed a sinus rhythm of 80-90.  Sites of anastomosis were inspected free of bleeding.  With body temperature rewarmed to 37 degrees, he was then ventilated and weaned from cardiopulmonary  bypass without difficulty.  He remained hemodynamically stable.  He was decannulated in usual fashion.  Protamine sulfate was administered with operative field hemostatic and atrial and ventricular pacing wires in place, pericardium was loosely reapproximated.  A left pleural tube and a Blake mediastinal drain were left in place.  Sternum was closed with #6 stainless steel wire.  Fascia was closed with interrupted 0 Vicryl, running 3-0 Vicryl in subcutaneous tissue, and 4-0 subcuticular stitch in skin edges.  Dry dressings were applied.  Sponge and needle count was reported as correct at the completion of the procedure.  The patient tolerated the procedure without obvious complication and was transferred to  the surgical intensive care unit for further postoperative care.  The patient did not require any blood products during the operative procedure.  At the completion of the procedure, the TEE has reviewed by Dr. Noreene Larsson and showed very trace mitral regurgitation and good overall LV function without wall motion abnormalities.  He was then transferred to surgical intensive care unit having tolerated the procedure without obvious complication.     Sheliah Plane, MD     EG/MEDQ  D:  05/23/2011  T:  05/23/2011  Job:  [id number]  cc:   Jake Bathe, MD

## 2011-05-23 NOTE — Progress Notes (Signed)
Patient ID: Larry Cooper, male   DOB: 03/24/1940, 71 y.o.   MRN: [id number] TCTS DAILY PROGRESS NOTE                   [address]            Larry Cooper [id number]          [phone]      1 Day Post-Op Procedure(s) (LRB): CORONARY ARTERY BYPASS GRAFTING (CABG) (N/A)  Total Length of Stay:  LOS: 4 days   Subjective: Extubated alert and neuro intact no confusion, but at high risk for delerium  Objective: Vital signs in last 24 hours: Temp:  [96.8 F (36 C)-99.5 F (37.5 C)] 97.5 F (36.4 C) (03/06 0811) Pulse Rate:  [70-90] 70  (03/06 0800) Cardiac Rhythm:  [-] Atrial paced (03/06 0800) Resp:  [6-20] 15  (03/06 0800) BP: (94-152)/(54-78) 115/62 mmHg (03/06 0800) SpO2:  [99 %-100 %] 100 % (03/06 0800) Arterial Line BP: (93-151)/(52-71) 129/56 mmHg (03/06 0800) FiO2 (%):  [39.9 %-50.3 %] 40.2 % (03/05 1955) Weight:  [198 lb 10.2 oz (90.1 kg)] 198 lb 10.2 oz (90.1 kg) (03/06 0500)  Filed Weights   05/21/11 0420 05/22/11 0413 05/23/11 0500  Weight: 191 lb 2.2 oz (86.7 kg) 191 lb 2.2 oz (86.7 kg) 198 lb 10.2 oz (90.1 kg)    Weight change: 7 lb 7.9 oz (3.4 kg)   Hemodynamic parameters for last 24 hours: PAP: (22-31)/(12-21) 28/12 mmHg CO:  [3.8 L/min-7.2 L/min] 4.6 L/min CI:  [1.8 L/min/m2-3.5 L/min/m2] 2.2 L/min/m2  Intake/Output from previous day: 03/05 0701 - 03/06 0700 In: 6467.3 [P.O.:250; I.V.:4425.3; Blood:750; NG/GT:90; IV Piggyback:952] Out: 2985 [Urine:2535; Emesis/NG output:150; Chest Tube:300]  Intake/Output this shift: Total I/O In: 78.8 [I.V.:78.8] Out: 30 [Urine:30]  Current Meds: Scheduled Meds:   . acetaminophen (TYLENOL) oral liquid 160 mg/5 mL  650 mg Per Tube NOW   Or  . acetaminophen  650 mg Rectal NOW  . acetaminophen  1,000 mg Oral Q6H   Or  . acetaminophen (TYLENOL) oral liquid 160 mg/5 mL  975 mg Per Tube Q6H  . amiodarone (NEXTERONE PREMIX) 360 mg/200 mL dextrose  60 mg/hr Intravenous To OR  . antiseptic oral rinse   15 mL Mouth Rinse BID  . aspirin EC  325 mg Oral Daily   Or  . aspirin  324 mg Per Tube Daily  . atorvastatin  80 mg Oral q1800  . bisacodyl  10 mg Oral Daily   Or  . bisacodyl  10 mg Rectal Daily  . cefUROXime (ZINACEF)  IV  1.5 g Intravenous To OR  . cefUROXime (ZINACEF)  IV  1.5 g Intravenous Q12H  . docusate sodium  200 mg Oral Daily  . famotidine (PEPCID) IV  20 mg Intravenous Q12H  . heparin-papaverine-plasmalyte irrigation   Irrigation To OR  . insulin (NOVOLIN-R) infusion   Intravenous To OR  . insulin regular  0-10 Units Intravenous TID WC  . magnesium sulfate      . magnesium sulfate  4 g Intravenous Once  . metoprolol tartrate  12.5 mg Oral BID   Or  . metoprolol tartrate  12.5 mg Per Tube BID  . mulitivitamin with minerals  1 tablet Oral Daily  . nitroGLYCERIN  2-200 mcg/min Intravenous To OR  . pantoprazole  40 mg Oral Q1200  . potassium chloride  10 mEq Intravenous Q1 Hr x 3  . sodium chloride  3 mL Intravenous Q12H  .  thiamine  100 mg Oral Daily  . vancomycin (VANCOCIN) IVPB 1000 mg/100 mL central line  1,000 mg Intravenous Once   Continuous Infusions:   . sodium chloride 20 mL/hr at 05/23/11 0430  . sodium chloride    . sodium chloride 250 mL (05/23/11 0006)  . amiodarone (NEXTERONE PREMIX) 360 mg/200 mL dextrose 0.501 mg/min (05/22/11 1900)   Followed by  . amiodarone (NEXTERONE PREMIX) 360 mg/200 mL dextrose 0.5 mg/min (05/23/11 0445)  . dexmedetomidine (PRECEDEX) IV infusion Stopped (05/22/11 1726)  . insulin (NOVOLIN-R) infusion 2.5 Units/hr (05/23/11 0815)  . lactated ringers 20 mL/hr at 05/22/11 1900  . nitroGLYCERIN Stopped (05/22/11 1820)  . phenylephrine (NEO-SYNEPHRINE) Adult infusion Stopped (05/22/11 1443)   PRN Meds:.albumin human, lactated ringers, metoprolol, midazolam, morphine injection, morphine, ondansetron (ZOFRAN) IV, potassium chloride, sodium chloride, traMADol, DISCONTD: sodium chloride, DISCONTD: 0.9 % irrigation (POUR BTL), DISCONTD:  acetaminophen, DISCONTD: acetaminophen, DISCONTD: alum & mag hydroxide-simeth, DISCONTD: fentaNYL, DISCONTD: fentaNYL, DISCONTD: hemostatic agents, DISCONTD: hemostatic agents, DISCONTD: midazolam, DISCONTD: midazolam DISCONTD: nitroGLYCERIN, DISCONTD: ondansetron (ZOFRAN) IV, DISCONTD: ondansetron (ZOFRAN) IV, DISCONTD: oxyCODONE-acetaminophen, DISCONTD: sodium chloride  General appearance: alert, cooperative and no distress Neurologic: intact Heart: regular rate and rhythm, S1, S2 normal, no murmur, click, rub or gallop and A paced at 90 Lungs: clear to auscultation bilaterally Abdomen: soft, non-tender; bowel sounds normal; no masses,  no organomegaly Extremities: extremities normal, atraumatic, no cyanosis or edema and Homans sign is negative, no sign of DVT no air leak  Lab Results: CBC: Basename 05/23/11 0450 05/22/11 2030 05/22/11 2000  WBC 12.4* -- 14.1*  HGB 11.6* 11.2* --  HCT 34.0* 33.0* --  PLT 146* -- 141*   BMET:  Basename 05/23/11 0450 05/22/11 2030 05/21/11 0555  NA 140 144 --  K 4.4 3.7 --  CL 110 111 --  CO2 25 -- 25  GLUCOSE 139* 126* --  BUN 10 8 --  CREATININE 0.63 0.70 --  CALCIUM 8.6 -- 9.7    PT/INR:  Basename 05/22/11 1430  LABPROT 17.5*  INR 1.41   Radiology: Ct Chest Wo Contrast  05/21/2011  *RADIOLOGY REPORT*  Clinical Data: Preop evaluation.  Rule out mass.  Plain film demonstrating possible pulmonary masses versus pleural plaques. Medical history of asbestosis.  Coronary artery disease.  Tobacco abuse.  CT CHEST WITHOUT CONTRAST  Technique:  Multidetector CT imaging of the chest was performed following the standard protocol without IV contrast.  Comparison: Plain film chest of 05/19/2011.  Findings: Lung windows demonstrate 4 mm right upper lobe lung nodule on image 18. Minimal dependent atelectasis.  No evidence of fibrosis to suggest asbestosis.  Left lung otherwise clear.  Soft tissue windows demonstrate 1.3 cm low right jugular/supraclavicular node  on image 4.  Similar to the 04/15/2011 exam.  Normal heart size. Multivessel coronary artery atherosclerosis.  No pericardial or pleural effusion.  Bilateral pleural plaques, the majority of which are calcified.  No evidence of dominant pleural mass to suggest mesothelioma.  Increased number of thoracic lymph nodes.  A precarinal node measures 1.2 cm on image 20.  Mild pulmonary artery enlargement. No definite hilar adenopathy, given limitation of unenhanced CT.  Small hiatal hernia.  Small prevascular nodes which are nonspecific.  Limited abdominal imaging demonstrates right adrenal nodule which is low density and likely an adenoma.  1.5 cm.  Nonacute fractures of the 7th through 10th right ribs.  The ninth and possibly seventh/eighth fractures are segmental.  Moderate thoracic spondylosis.  IMPRESSION: 1.  The plain film abnormality  was secondary to bilateral calcified pleural plaques, consistent with asbestos related pleural disease. 2.  No worrisome lung mass.  A 4 mm right upper lobe lung nodule. Given risk factors for bronchogenic carcinoma, follow-up chest CT at 1 year is recommended.  This recommendation follows the consensus statement:  "Guidelines for Management of Small Pulmonary Nodules Detected on CT Scans:  A Statement from the Fleischner Society" as published in Radiology 2005; 237:395-400.  Available online at:  DietDisorder.cz. 3.  Mild thoracic and lower cervical adenopathy.  Consider chest CT follow-up at 3 months to confirm stability or resolution. 4.  Nonacute (possibly subacute) right-sided rib fractures. 5. Pulmonary artery enlargement suggests pulmonary arterial hypertension.  Original Report Authenticated By: Consuello Bossier, M.D.   Dg Chest Portable 1 View In Am  05/23/2011  *RADIOLOGY REPORT*  Clinical Data: Bypass surgery.  PORTABLE CHEST - 1 VIEW  Comparison: 05/22/2011.  Findings: The endotracheal tube and NG tubes have been removed. The Swan-Ganz  catheter, left-sided chest tube and mediastinal drain tubes are stable.  The heart is mildly enlarged and there is persistent vascular congestion and areas of atelectasis but no edema or pneumothorax.  IMPRESSION:  1.  Removal of ET and NG tubes. 2.  Remaining support apparatus is stable.  No left-sided pneumothorax. 3.  Slight improved aeration.  Persistent mild vascular congestion and areas of atelectasis.  Original Report Authenticated By: P. Loralie Champagne, M.D.   Dg Chest Portable 1 View  05/22/2011  *RADIOLOGY REPORT*  Clinical Data: 71 year old male status post CABG.  Chest tube placement.  PORTABLE CHEST - 1 VIEW  Comparison: 05/19/2011.  Findings: Portable semi upright AP view 1435 hours.  Endotracheal tube tip in good position at the level of clavicles.  Enteric tube courses to the abdomen, side hole the level of the gastric cardia. Right IJ approach Swan-Ganz catheter in place, tip at the level of the proximal right lower lobe pulmonary artery.  Mediastinal drain and left chest tube in place.  No pneumothorax.  No pulmonary edema.  Scattered atelectasis.  No definite effusion.  Cardiac size and mediastinal contours are within normal limits.  Sequelae of CABG.  IMPRESSION: 1.  Lines and tubes appear appropriately placed as above. 2.  Mild atelectasis.  No pneumothorax or edema.  Original Report Authenticated By: Harley Hallmark, M.D.     Assessment/Plan: S/P Procedure(s) (LRB): CORONARY ARTERY BYPASS GRAFTING (CABG) (N/A) Mobilize Diuresis d/c tubes/lines See progression orders HGB 11 expected acute blood loss anemia Observe for delerium   Delight Ovens MD  Beeper [phone] Office [phone] 05/23/2011 8:54 AM

## 2011-05-23 NOTE — Progress Notes (Signed)
[facility] BRIEF SICU PROGRESS NOTE  1 Day Post-Op  S/P Procedure(s) (LRB): CORONARY ARTERY BYPASS GRAFTING (CABG) (N/A)   Stable day No complaints NSR BP stable O2 sats 98% on 2L via [place] UOP adequate  Plan: Continue current plan  Fabion Gatson H 05/23/2011 5:54 PM

## 2011-05-23 NOTE — Progress Notes (Signed)
Anesthesiology followup:  Larry Cooper is sitting in the chair and is in good spirits and taking by mouth liquids well. He has been ambulating with assistance.  . Vital signs: Temperature 36.4 BP 160/68 heart rate: 74 (atrial pacing) respiratory rate 16 O2 sat 99% on 2 L   H/H : 11.6/34.1 Plts: 138K Cr 0.6 glucose 383  Extubated 3 1/2 hours after surgery Eventful post-op course so far.  [name], MD         :

## 2011-05-23 NOTE — Addendum Note (Signed)
Addendum  created 05/23/11 2017 by Melonie Florida, MD   Modules edited:Notes Section

## 2011-05-23 NOTE — Progress Notes (Signed)
   CARE MANAGEMENT NOTE 05/23/2011  Patient:  Larry Cooper, Larry Cooper   Account Number:  [id number]  Date Initiated:  05/21/2011  Documentation initiated by:  GRAVES-BIGELOW,BRENDA  Subjective/Objective Assessment:   Pt admitted with Chest pain. Plan for CABG in am.     Action/Plan:   PTA, PT LIVES ALONE AND IS INDEPENDENT.   Anticipated DC Date:  05/27/2011   Anticipated DC Plan:  HOME W HOME HEALTH SERVICES      DC Planning Services  CM consult      Choice offered to / List presented to:             Status of service:   Medicare Important Message given?   (If response is "NO", the following Medicare IM given date fields will be blank) Date Medicare IM given:   Date Additional Medicare IM given:    Discharge Disposition:    Per UR Regulation:    Comments:  05/23/11 Larry Nonaka,RN,BSN 1450 MET WITH PT TO DISCUSS DC PLANS.  PT STATES THAT HIS EX WIFE AND DAUGHTER WILL PROVIDE 24HR CARE AT DISCHARGE. WILL FOLLOW FOR HOME NEEDS AS PT PROGRESSES. Phone #[phone]  05-21-11 1559 Tomi Bamberger, [place] [phone] CM will continue to monitor for d/c disposition.

## 2011-05-23 NOTE — Op Note (Signed)
Intraoperative transesophageal echo report  Mr. Larry Cooper is a 71 year old male with a long-standing history of coronary artery disease who has had multiple previous angioplasties with stents and is now scheduled to undergo coronary artery bypass grafting by Dr. Tyrone Sage.  He was brought to the operating room [facility] and general anesthesia was induced without difficulty. Following endotracheal intubation an orogastric suctioning the transesophageal echocardiography probe was inserted without difficulty.  Impression: Pre-bypass findings:  1. Aortic Valve: The aortic valve opened normally and is trileaflet. There is no aortic insufficiency and no thickening of the leaflets.  2. Mitral valve: There was moderate mitral annular calcification and the leaflets opened normally and there was no prolapse of the leaflets. There was trace to 1+ mitral insufficiency with a central jet.  3. Left ventricle: There is vigorous contractility in all segments interrogated with an ejection fraction estimated 60-65% left ventricular mass thickness measured 0.95-1.05 cm at end-diastole at the mid-papillary level.   4. Right Ventricle: There was normal appearing right ventricular function with good contractility of the right ventricular free wall and normal right ventricular size.  5. Tricuspid valve: The tricuspid valve appeared structurally normal and there was trace tricuspid insufficiency.  6. Inter-atrial septum: The interatrial septum was intact without evidence of patent foramen ovale or atrial septal defect by color Doppler or bubble study.  7. Left Atrium: There was no thrombus noted in the left atrium or left atrial appendage and the left atrium is normal in size.  8. Ascending Aorta: There was moderate thickening of the ascending aorta with mild calcification noted but no severe atheromatous disease appreciated.  Post-bypass findings:  1. Aortic Valve: The aortic valve appeared normal  and is unchanged from the pre-bypass study  2. Mitral Valve: There was 1+ mitral insufficiency and no change from the pre-bypass study.  3. Left Ventricle: There was normal contractility in all segments interrogated with an ejection fraction of 55-60%.  4. Right Ventricle: There was no normal-appearing right ventricular function.  [name] M.D.

## 2011-05-23 NOTE — Progress Notes (Signed)
Inpatient Diabetes Program Recommendations  AACE/ADA: New Consensus Statement on Inpatient Glycemic Control (2009)  Target Ranges:  Prepandial:   less than 140 mg/dL      Peak postprandial:   less than 180 mg/dL (1-2 hours)      Critically ill patients:  140 - 180 mg/dL   Reason for Visit: According to medication reconciliation, patient took 70/30 insulin 20 units 3 times a day.  Note CBG's back up to 242 mg/dL at lunch.  Meets criteria to restart insulin drip. Consider increasing Lantus to 32 units daily.  Also, consider adding Novolog meal coverage 6 units tid with meals.

## 2011-05-24 ENCOUNTER — Encounter (HOSPITAL_COMMUNITY): Payer: Self-pay | Admitting: Cardiothoracic Surgery

## 2011-05-24 ENCOUNTER — Inpatient Hospital Stay (HOSPITAL_COMMUNITY): Payer: Medicare PPO

## 2011-05-24 LAB — GLUCOSE, CAPILLARY
Glucose-Capillary: 102 mg/dL — ABNORMAL HIGH (ref 70–99)
Glucose-Capillary: 108 mg/dL — ABNORMAL HIGH (ref 70–99)
Glucose-Capillary: 108 mg/dL — ABNORMAL HIGH (ref 70–99)
Glucose-Capillary: 109 mg/dL — ABNORMAL HIGH (ref 70–99)
Glucose-Capillary: 128 mg/dL — ABNORMAL HIGH (ref 70–99)
Glucose-Capillary: 129 mg/dL — ABNORMAL HIGH (ref 70–99)
Glucose-Capillary: 164 mg/dL — ABNORMAL HIGH (ref 70–99)
Glucose-Capillary: 178 mg/dL — ABNORMAL HIGH (ref 70–99)
Glucose-Capillary: 210 mg/dL — ABNORMAL HIGH (ref 70–99)
Glucose-Capillary: 261 mg/dL — ABNORMAL HIGH (ref 70–99)
Glucose-Capillary: 293 mg/dL — ABNORMAL HIGH (ref 70–99)
Glucose-Capillary: 85 mg/dL (ref 70–99)
Glucose-Capillary: 93 mg/dL (ref 70–99)
Glucose-Capillary: 94 mg/dL (ref 70–99)

## 2011-05-24 LAB — BASIC METABOLIC PANEL
BUN: 19 mg/dL (ref 6–23)
CO2: 27 mEq/L (ref 19–32)
Calcium: 8.4 mg/dL (ref 8.4–10.5)
Chloride: 104 mEq/L (ref 96–112)
Creatinine, Ser: 0.72 mg/dL (ref 0.50–1.35)
GFR calc Af Amer: >90 mL/min (ref >90)
GFR calc non Af Amer: >90 mL/min (ref >90)
Glucose, Bld: 104 mg/dL — ABNORMAL HIGH (ref 70–99)
Potassium: 4.1 mEq/L (ref 3.5–5.1)
Sodium: 136 mEq/L (ref 135–145)

## 2011-05-24 LAB — CBC
HCT: 31 % — ABNORMAL LOW (ref 39.0–52.0)
Hemoglobin: 10.3 g/dL — ABNORMAL LOW (ref 13.0–17.0)
MCH: 28.9 pg (ref 26.0–34.0)
MCHC: 33.2 g/dL (ref 30.0–36.0)
MCV: 86.8 fL (ref 78.0–100.0)
Platelets: 129 10*3/uL — ABNORMAL LOW (ref 150–400)
RBC: 3.57 MIL/uL — ABNORMAL LOW (ref 4.22–5.81)
RDW: 15 % (ref 11.5–15.5)
WBC: 12.6 10*3/uL — ABNORMAL HIGH (ref 4.0–10.5)

## 2011-05-24 MED ORDER — ONDANSETRON HCL 4 MG/2ML IJ SOLN: 4.0000 mg | Freq: Four times a day (QID) | INTRAMUSCULAR | Status: DC | PRN

## 2011-05-24 MED ORDER — INSULIN GLARGINE 100 UNIT/ML ~~LOC~~ SOLN: 20.0000 [IU] | Freq: Two times a day (BID) | SUBCUTANEOUS | Status: DC

## 2011-05-24 MED ORDER — OXYCODONE HCL 5 MG PO TABS: 5.0000 mg | ORAL_TABLET | ORAL | Status: DC | PRN

## 2011-05-24 MED ORDER — SODIUM CHLORIDE 0.9 % IJ SOLN
3.0000 mL | Freq: Two times a day (BID) | INTRAMUSCULAR | Status: DC
  Administered 2011-05-24 – 2011-05-26 (×6): 3 mL via INTRAVENOUS

## 2011-05-24 MED ORDER — DOCUSATE SODIUM 100 MG PO CAPS
200.0000 mg | ORAL_CAPSULE | Freq: Every day | ORAL | Status: DC
  Administered 2011-05-24 – 2011-05-26 (×3): 200 mg via ORAL
  Filled 2011-05-24 (×4): qty 2

## 2011-05-24 MED ORDER — MOVING RIGHT ALONG BOOK
Freq: Once | Status: AC
  Administered 2011-05-24: 11:00:00
  Filled 2011-05-24: qty 1

## 2011-05-24 MED ORDER — LISINOPRIL 2.5 MG PO TABS
2.5000 mg | ORAL_TABLET | Freq: Every day | ORAL | Status: DC
  Administered 2011-05-24 – 2011-05-25 (×2): 2.5 mg via ORAL
  Filled 2011-05-24 (×3): qty 1

## 2011-05-24 MED ORDER — METFORMIN HCL 500 MG PO TABS
1000.0000 mg | ORAL_TABLET | Freq: Two times a day (BID) | ORAL | Status: DC
  Administered 2011-05-24 – 2011-05-27 (×7): 1000 mg via ORAL
  Filled 2011-05-24 (×9): qty 2

## 2011-05-24 MED ORDER — CLOPIDOGREL BISULFATE 75 MG PO TABS
75.0000 mg | ORAL_TABLET | Freq: Every day | ORAL | Status: DC
  Filled 2011-05-24: qty 1

## 2011-05-24 MED ORDER — INSULIN ASPART 100 UNIT/ML ~~LOC~~ SOLN
0.0000 [IU] | Freq: Three times a day (TID) | SUBCUTANEOUS | Status: DC
  Administered 2011-05-24 (×2): 4 [IU] via SUBCUTANEOUS
  Administered 2011-05-24 – 2011-05-25 (×3): 2 [IU] via SUBCUTANEOUS
  Administered 2011-05-25 – 2011-05-26 (×4): 4 [IU] via SUBCUTANEOUS
  Filled 2011-05-24: qty 3

## 2011-05-24 MED ORDER — SODIUM CHLORIDE 0.9 % IV SOLN: 250.0000 mL | INTRAVENOUS | Status: DC | PRN

## 2011-05-24 MED ORDER — TRAMADOL HCL 50 MG PO TABS: 50.0000 mg | ORAL_TABLET | ORAL | Status: DC | PRN

## 2011-05-24 MED ORDER — BISACODYL 5 MG PO TBEC: 10.0000 mg | DELAYED_RELEASE_TABLET | Freq: Every day | ORAL | Status: DC | PRN

## 2011-05-24 MED ORDER — PANTOPRAZOLE SODIUM 40 MG PO TBEC
40.0000 mg | DELAYED_RELEASE_TABLET | Freq: Every day | ORAL | Status: DC
  Administered 2011-05-24 – 2011-05-27 (×4): 40 mg via ORAL
  Filled 2011-05-24 (×4): qty 1

## 2011-05-24 MED ORDER — INSULIN GLARGINE 100 UNIT/ML ~~LOC~~ SOLN: 20.0000 [IU] | Freq: Every day | SUBCUTANEOUS | Status: DC

## 2011-05-24 MED ORDER — ONDANSETRON HCL 4 MG PO TABS: 4.0000 mg | ORAL_TABLET | Freq: Four times a day (QID) | ORAL | Status: DC | PRN

## 2011-05-24 MED ORDER — SODIUM CHLORIDE 0.9 % IJ SOLN: 3.0000 mL | INTRAMUSCULAR | Status: DC | PRN

## 2011-05-24 MED ORDER — BISACODYL 10 MG RE SUPP: 10.0000 mg | Freq: Every day | RECTAL | Status: DC | PRN

## 2011-05-24 MED ORDER — INSULIN GLARGINE 100 UNIT/ML ~~LOC~~ SOLN
32.0000 [IU] | Freq: Every day | SUBCUTANEOUS | Status: DC
  Administered 2011-05-24 – 2011-05-27 (×4): 32 [IU] via SUBCUTANEOUS

## 2011-05-24 MED ORDER — ASPIRIN EC 81 MG PO TBEC
81.0000 mg | DELAYED_RELEASE_TABLET | Freq: Every day | ORAL | Status: DC
  Administered 2011-05-24 – 2011-05-27 (×4): 81 mg via ORAL
  Filled 2011-05-24 (×4): qty 1

## 2011-05-24 NOTE — Progress Notes (Signed)
Courtesy visit  Progressing well.  Post CABG

## 2011-05-24 NOTE — Progress Notes (Signed)
Patient ID: Larry Cooper, male   DOB: 28-Sep-1940, 71 y.o.   MRN: [id number] TCTS DAILY PROGRESS NOTE                   [address]            [place] [address]          [phone]      2 Days Post-Op Procedure(s) (LRB): CORONARY ARTERY BYPASS GRAFTING (CABG) (N/A)  Total Length of Stay:  LOS: 5 days   Subjective: Walking in unit well  Objective: Vital signs in last 24 hours: Temp:  [97.3 F (36.3 C)-98.8 F (37.1 C)] 98.8 F (37.1 C) (03/07 0744) Pulse Rate:  [70-74] 70  (03/07 0700) Cardiac Rhythm:  [-] Atrial paced (03/07 0700) Resp:  [13-21] 19  (03/07 0700) BP: (113-160)/(55-79) 130/60 mmHg (03/07 0700) SpO2:  [95 %-100 %] 95 % (03/07 0700) Arterial Line BP: (145-164)/(63-69) 145/68 mmHg (03/06 1100) Weight:  [203 lb 0.7 oz (92.1 kg)] 203 lb 0.7 oz (92.1 kg) (03/07 0600)  Filed Weights   05/22/11 0413 05/23/11 0500 05/24/11 0600  Weight: 191 lb 2.2 oz (86.7 kg) 198 lb 10.2 oz (90.1 kg) 203 lb 0.7 oz (92.1 kg)    Weight change: 4 lb 6.6 oz (2 kg)   Hemodynamic parameters for last 24 hours: PAP: (32)/(14-20) 32/14 mmHg  Intake/Output from previous day: 03/06 0701 - 03/07 0700 In: 1402.8 [P.O.:600; I.V.:700.8; IV Piggyback:102] Out: 1575 [Urine:1525; Chest Tube:50]  Intake/Output this shift:    Current Meds: Scheduled Meds:   . acetaminophen  1,000 mg Oral Q6H   Or  . acetaminophen (TYLENOL) oral liquid 160 mg/5 mL  975 mg Per Tube Q6H  . amiodarone  200 mg Oral Q12H   Followed by  . amiodarone  200 mg Oral Daily  . antiseptic oral rinse  15 mL Mouth Rinse BID  . aspirin EC  325 mg Oral Daily   Or  . aspirin  324 mg Per Tube Daily  . atorvastatin  80 mg Oral q1800  . bisacodyl  10 mg Oral Daily   Or  . bisacodyl  10 mg Rectal Daily  . cefUROXime (ZINACEF)  IV  1.5 g Intravenous Q12H  . clopidogrel  75 mg Oral Q breakfast  . docusate sodium  200 mg Oral Daily  . famotidine (PEPCID) IV  20 mg Intravenous Q12H  . furosemide  40  mg Intravenous Once  . insulin aspart  0-24 Units Subcutaneous Q4H  . insulin glargine  20 Units Subcutaneous Daily  . metoprolol tartrate  12.5 mg Oral BID   Or  . metoprolol tartrate  12.5 mg Per Tube BID  . mulitivitamin with minerals  1 tablet Oral Daily  . pantoprazole  40 mg Oral Q1200  . sodium chloride  3 mL Intravenous Q12H  . thiamine  100 mg Oral Daily  . DISCONTD: insulin glargine  20 Units Subcutaneous BH-q7a  . DISCONTD: insulin regular  0-10 Units Intravenous TID WC   Continuous Infusions:   . sodium chloride Stopped (05/23/11 1050)  . sodium chloride    . sodium chloride 250 mL (05/23/11 0006)  . dexmedetomidine (PRECEDEX) IV infusion Stopped (05/22/11 1726)  . insulin (NOVOLIN-R) infusion 1.9 mL/hr at 05/24/11 0600  . lactated ringers 20 mL/hr (05/23/11 2219)  . nitroGLYCERIN Stopped (05/22/11 1820)  . phenylephrine (NEO-SYNEPHRINE) Adult infusion Stopped (05/22/11 1443)  . DISCONTD: amiodarone (NEXTERONE PREMIX) 360 mg/200 mL dextrose 0.5 mg/min (05/23/11  0445)   PRN Meds:.albumin human, metoprolol, midazolam, morphine, ondansetron (ZOFRAN) IV, sodium chloride, traMADol  General appearance: alert, cooperative and no distress Neurologic: intact Lungs: clear to auscultation bilaterally Abdomen: soft, non-tender; bowel sounds normal; no masses,  no organomegaly Extremities: extremities normal, atraumatic, no cyanosis or edema  Lab Results: CBC: Basename 05/24/11 0415 05/23/11 1808 05/23/11 1700  WBC 12.6* -- 14.6*  HGB 10.3* 11.2* --  HCT 31.0* 33.0* --  PLT 129* -- 138*   BMET:  Basename 05/24/11 0415 05/23/11 1808 05/23/11 0450  NA 136 136 --  K 4.1 4.5 --  CL 104 100 --  CO2 27 -- 25  GLUCOSE 104* 383* --  BUN 19 15 --  CREATININE 0.72 0.60 --  CALCIUM 8.4 -- 8.6    PT/INR:  Basename 05/22/11 1430  LABPROT 17.5*  INR 1.41   Radiology: Dg Chest Portable 1 View In Am  05/24/2011  *RADIOLOGY REPORT*  Clinical Data: 71 year old male status post  open heart surgery.  PORTABLE CHEST - 1 VIEW  Comparison: 05/23/2011 and earlier.  Findings: Seated upright AP portable view 0552 hours.  Swan-Ganz catheter has been removed.  Right IJ introducer sheath remains. Left chest tube has been removed.  No pneumothorax. Mediastinal drain removed.  Slightly lower lung volumes.  Stable cardiac size and mediastinal contours.  No pulmonary edema.  Atelectasis. Probable small left effusion.  IMPRESSION: 1.  Left chest tube removed.  No pneumothorax identified. 2.  Swan-Ganz catheter removed.  Mediastinal drain removed. 3.  Lower lung volumes with atelectasis and probable small left effusion.  Original Report Authenticated By: Harley Hallmark, M.D.   Dg Chest Portable 1 View In Am  05/23/2011  *RADIOLOGY REPORT*  Clinical Data: Bypass surgery.  PORTABLE CHEST - 1 VIEW  Comparison: 05/22/2011.  Findings: The endotracheal tube and NG tubes have been removed. The Swan-Ganz catheter, left-sided chest tube and mediastinal drain tubes are stable.  The heart is mildly enlarged and there is persistent vascular congestion and areas of atelectasis but no edema or pneumothorax.  IMPRESSION:  1.  Removal of ET and NG tubes. 2.  Remaining support apparatus is stable.  No left-sided pneumothorax. 3.  Slight improved aeration.  Persistent mild vascular congestion and areas of atelectasis.  Original Report Authenticated By: P. Loralie Champagne, M.D.   Dg Chest Portable 1 View  05/22/2011  *RADIOLOGY REPORT*  Clinical Data: 71 year old male status post CABG.  Chest tube placement.  PORTABLE CHEST - 1 VIEW  Comparison: 05/19/2011.  Findings: Portable semi upright AP view 1435 hours.  Endotracheal tube tip in good position at the level of clavicles.  Enteric tube courses to the abdomen, side hole the level of the gastric cardia. Right IJ approach Swan-Ganz catheter in place, tip at the level of the proximal right lower lobe pulmonary artery.  Mediastinal drain and left chest tube in place.  No  pneumothorax.  No pulmonary edema.  Scattered atelectasis.  No definite effusion.  Cardiac size and mediastinal contours are within normal limits.  Sequelae of CABG.  IMPRESSION: 1.  Lines and tubes appear appropriately placed as above. 2.  Mild atelectasis.  No pneumothorax or edema.  Original Report Authenticated By: Harley Hallmark, M.D.     Assessment/Plan: S/P Procedure(s) (LRB): CORONARY ARTERY BYPASS GRAFTING (CABG) (N/A) Diabetes control increase lantus difficult to control glucose Plan for transfer to step-down: see transfer orders     Delight Ovens MD  Beeper [phone] Office [phone] 05/24/2011 8:23 AM

## 2011-05-24 NOTE — Plan of Care (Signed)
Problem: Phase III Progression Outcomes Goal: Transfer to PCTU/Telemetry POD Outcome: Completed/Met Date Met:  05/24/11 2000 Goal: Time patient transferred to PCTU/Telemetry POD Outcome: Completed/Met Date Met:  05/24/11 1655

## 2011-05-25 ENCOUNTER — Other Ambulatory Visit: Payer: Self-pay

## 2011-05-25 LAB — CBC
HCT: 29.5 % — ABNORMAL LOW (ref 39.0–52.0)
Hemoglobin: 10 g/dL — ABNORMAL LOW (ref 13.0–17.0)
MCH: 29.1 pg (ref 26.0–34.0)
MCHC: 33.9 g/dL (ref 30.0–36.0)
MCV: 85.8 fL (ref 78.0–100.0)
Platelets: 139 10*3/uL — ABNORMAL LOW (ref 150–400)
RBC: 3.44 MIL/uL — ABNORMAL LOW (ref 4.22–5.81)
RDW: 14.9 % (ref 11.5–15.5)
WBC: 10.5 10*3/uL (ref 4.0–10.5)

## 2011-05-25 LAB — BASIC METABOLIC PANEL
BUN: 17 mg/dL (ref 6–23)
CO2: 27 mEq/L (ref 19–32)
Calcium: 8.9 mg/dL (ref 8.4–10.5)
Chloride: 104 mEq/L (ref 96–112)
Creatinine, Ser: 0.74 mg/dL (ref 0.50–1.35)
GFR calc Af Amer: >90 mL/min (ref >90)
GFR calc non Af Amer: >90 mL/min (ref >90)
Glucose, Bld: 149 mg/dL — ABNORMAL HIGH (ref 70–99)
Potassium: 4.3 mEq/L (ref 3.5–5.1)
Sodium: 137 mEq/L (ref 135–145)

## 2011-05-25 LAB — GLUCOSE, CAPILLARY
Glucose-Capillary: 128 mg/dL — ABNORMAL HIGH (ref 70–99)
Glucose-Capillary: 165 mg/dL — ABNORMAL HIGH (ref 70–99)
Glucose-Capillary: 190 mg/dL — ABNORMAL HIGH (ref 70–99)
Glucose-Capillary: 143 mg/dL — ABNORMAL HIGH (ref 70–99)

## 2011-05-25 MED ORDER — CLOPIDOGREL BISULFATE 75 MG PO TABS: 75.0000 mg | ORAL_TABLET | Freq: Every day | ORAL | Status: DC

## 2011-05-25 MED ORDER — INSULIN ASPART 100 UNIT/ML ~~LOC~~ SOLN
4.0000 [IU] | Freq: Three times a day (TID) | SUBCUTANEOUS | Status: DC
  Administered 2011-05-25 – 2011-05-27 (×6): 4 [IU] via SUBCUTANEOUS

## 2011-05-25 MED ORDER — INSULIN GLARGINE 100 UNIT/ML ~~LOC~~ SOLN: 32.0000 [IU] | Freq: Every day | SUBCUTANEOUS | Status: DC

## 2011-05-25 MED ORDER — AMIODARONE HCL 400 MG PO TABS: 400.0000 mg | ORAL_TABLET | Freq: Two times a day (BID) | ORAL | Status: DC

## 2011-05-25 MED ORDER — METOPROLOL TARTRATE 1 MG/ML IV SOLN
INTRAVENOUS | Status: AC
  Administered 2011-05-25: 5 mg via INTRAVENOUS
  Filled 2011-05-25: qty 5

## 2011-05-25 MED ORDER — ATORVASTATIN CALCIUM 80 MG PO TABS: 80.0000 mg | ORAL_TABLET | Freq: Every day | ORAL | Status: DC

## 2011-05-25 MED ORDER — METOPROLOL TARTRATE 25 MG PO TABS
25.0000 mg | ORAL_TABLET | Freq: Two times a day (BID) | ORAL | Status: DC
  Administered 2011-05-25 – 2011-05-27 (×5): 25 mg via ORAL
  Filled 2011-05-25 (×6): qty 1

## 2011-05-25 MED ORDER — LISINOPRIL 2.5 MG PO TABS: 2.5000 mg | ORAL_TABLET | Freq: Every day | ORAL | Status: DC

## 2011-05-25 MED ORDER — THIAMINE HCL 100 MG PO TABS: 100.0000 mg | ORAL_TABLET | Freq: Every day | ORAL | Status: AC

## 2011-05-25 MED ORDER — METOPROLOL TARTRATE 1 MG/ML IV SOLN
5.0000 mg | INTRAVENOUS | Status: AC
  Administered 2011-05-25: 5 mg via INTRAVENOUS

## 2011-05-25 MED ORDER — ASPIRIN 81 MG PO TBEC: 81.0000 mg | DELAYED_RELEASE_TABLET | Freq: Every day | ORAL | Status: DC

## 2011-05-25 MED ORDER — AMIODARONE HCL 200 MG PO TABS
400.0000 mg | ORAL_TABLET | Freq: Two times a day (BID) | ORAL | Status: DC
  Administered 2011-05-25 – 2011-05-27 (×5): 400 mg via ORAL
  Filled 2011-05-25 (×6): qty 2

## 2011-05-25 MED ORDER — OXYCODONE HCL 5 MG PO TABS: 5.0000 mg | ORAL_TABLET | ORAL | Status: DC | PRN

## 2011-05-25 MED ORDER — METOPROLOL TARTRATE 25 MG PO TABS: 25.0000 mg | ORAL_TABLET | Freq: Two times a day (BID) | ORAL | Status: DC

## 2011-05-25 NOTE — Progress Notes (Addendum)
                    [address]            [name] [address]          [phone]     3 Days Post-Op Procedure(s) (LRB): CORONARY ARTERY BYPASS GRAFTING (CABG) (N/A)  Subjective: Just went into AF, pt asymptomatic.  Rates 100-120s.  Otherwise has been feeling well, no complaints.  Objective: Vital signs in last 24 hours: Patient Vitals for the past 24 hrs:  BP Temp Temp src Pulse Resp SpO2 Weight  05/25/11 0442 147/65 mmHg 98.6 F (37 C) Oral 70  20  90 % 95.6 kg (210 lb 12.2 oz)  05/24/11 2024 154/65 mmHg 98.8 F (37.1 C) Oral 69  18  98 % -  05/24/11 1708 182/65 mmHg 98.7 F (37.1 C) Oral 70  18  95 % -  05/24/11 1600 147/60 mmHg - - 66  22  96 % -  05/24/11 1558 - 99.3 F (37.4 C) - - - - -  05/24/11 1500 168/67 mmHg - - 68  17  99 % -  05/24/11 1400 152/60 mmHg - - 68  15  100 % -  05/24/11 1300 126/53 mmHg - - 58  21  97 % -  05/24/11 1206 - 98.7 F (37.1 C) Oral - - - -  05/24/11 1200 117/60 mmHg - - 58  20  97 % -  05/24/11 1100 128/62 mmHg - - 63  19  99 % -  05/24/11 1000 118/56 mmHg - - 65  18  98 % -  05/24/11 0900 132/63 mmHg - - 65  18  97 % -   Current Weight  05/25/11 95.6 kg (210 lb 12.2 oz)  PRE OP WEIGHT: 87 kg    Intake/Output from previous day: 03/07 0701 - 03/08 0700 In: 1000 [P.O.:960; I.V.:40] Out: 940 [Urine:940]  CBGs [phone]  PHYSICAL EXAM:  Heart: irr irr, rates 110s Lungs: slightly decreased BS in bases Wound: clean and dry Extremities: mild LE edema  Lab Results: CBC: Basename 05/25/11 0600 05/24/11 0415  WBC 10.5 12.6*  HGB 10.0* 10.3*  HCT 29.5* 31.0*  PLT 139* 129*   BMET:  Basename 05/25/11 0600 05/24/11 0415  NA 137 136  K 4.3 4.1  CL 104 104  CO2 27 27  GLUCOSE 149* 104*  BUN 17 19  CREATININE 0.74 0.72  CALCIUM 8.9 8.4    PT/INR:  Basename 05/22/11 1430  LABPROT 17.5*  INR 1.41     Assessment/Plan: S/P Procedure(s) (LRB): CORONARY ARTERY BYPASS GRAFTING (CABG) (N/A) CV-  AF this am, HR 110-120s, BP 175 systolic.  Will give 1 dose IV Lopressor and increase po Lopressor to 25 mg bid.  Was started on po Amio post-op secondary to intra-op ventricular arrhythmias.  Will increase to 400 mg bid. DM- sugars stable, back on po meds + Lantus.  A1C=8.0 Will add meal coverage. May need additional agent vs Lantus for home. CRPI, pulm toilet.   LOS: 6 days    COLLINS,GINA H 05/25/2011   a fib this am  Now back in sinus Home 1-2 days I have seen and examined Larry Cooper and agree with the above assessment  and plan.  Delight Ovens MD Beeper [phone] Office [phone] 05/25/2011 1:36 PM

## 2011-05-25 NOTE — Progress Notes (Signed)
  CARDIAC REHAB PHASE I   PRE:  Rate/Rhythm: 74SR  BP:  Supine: 153/76  Sitting:   Standing:    SaO2: 93%RA  MODE:  Ambulation: 350 ft   POST:  Rate/Rhythem: 83SR  BP:  Supine:   Sitting: 162/65  Standing:    SaO2: 97%RA 1130-1155 Pt walked 350 ft on RA with rolling walker and asst x 2 with steady gait. Can be asst x 1 with walker. Tolerated well. To recliner with call bell. Set up lunch.  Larry Cooper

## 2011-05-25 NOTE — Discharge Instructions (Signed)
Coronary Artery Bypass Grafting Care After Refer to this sheet in the next few weeks. These instructions provide you with information on caring for yourself after your procedure. Your caregiver may also give you more specific instructions. Your treatment has been planned according to current medical practices, but problems sometimes occur. Call your caregiver if you have any problems or questions after your procedure.   Recovery from open heart surgery will be different for everyone. Some people feel well after 3 or 4 weeks, while for others it takes longer. After heart surgery, it may be normal to:  Not have an appetite, feel nauseated by the smell of food, or only want to eat a small amount.   Be constipated because of changes in your diet, activity, and medicines. Eat foods high in fiber. Add fresh fruits and vegetables to your diet. Stool softeners may be helpful.   Feel sad or unhappy. You may be frustrated or cranky. You may have good days and bad days. Do not give up. Talk to your caregiver if you do not feel better.   Feel weakness and fatigue. You many need physical therapy or cardiac rehabilitation to get your strength back.   Develop an irregular heartbeat called atrial fibrillation. Symptoms of atrial fibrillation are a fast, irregular heartbeat or feelings of fluttery heartbeats, shortness of breath, low blood pressure, and dizziness. If these symptoms develop, see your caregiver right away.  MEDICATION  Have a list of all the medicines you will be taking when you leave the hospital. For every medicine, know the following:   Name.   Exact dose.   Time of day to be taken.   How often it should be taken.   Why you are taking it.   Ask which medicines should or should not be taken together. If you take more than one heart medicine, ask if it is okay to take them together. Some heart medicines should not be taken at the same time because they may lower your blood pressure too  much.   Narcotic pain medicine can cause constipation. Eat fresh fruits and vegetables. Add fiber to your diet. Stool softener medicine may help relieve constipation.   Keep a copy of your medicines with you at all times.   Do not add or stop taking any medicine until you check with your caregiver.   Medicines can have side effects. Call your caregiver who prescribed the medicine if you:   Start throwing up, have diarrhea, or have stomach pain.   Feel dizzy or lightheaded when you stand up.   Feel your heart is skipping beats or is beating too fast or too slow.   Develop a rash.   Notice unusual bruising or bleeding.  HOME CARE INSTRUCTIONS  After heart surgery, it is important to learn how to take your pulse. Have your caregiver show you how to take your pulse.   Use your incentive spirometer. Ask your caregiver how long after surgery you need to use it.  Care of your chest incision  Tell your caregiver right away if you notice clicking in your chest (sternum).   Support your chest with a pillow or your arms when you take deep breaths and cough.   Follow your caregiver's instructions about when you can bathe or swim.   Protect your incision from sunlight during the first year to keep the scar from getting dark.   Tell your caregiver if you notice:   Increased tenderness of your incision.   Increased   redness or swelling around your incision.   Drainage or pus from your incision.  Care of your leg incision(s)  Avoid crossing your legs.   Avoid sitting for long periods of time. Change positions every half hour.   Elevate your leg(s) when you are sitting.   Check your leg(s) daily for swelling. Check the incisions for redness or drainage.   Wear your elastic stockings as told by your caregiver. Take them off at bedtime.  Diet  Diet is very important to heart health.   Eat plenty of fresh fruits and vegetables. Meats should be lean cut. Avoid canned, processed, and  fried foods.   Talk to a dietician. They can teach you how to make healthy food and drink choices.  Weight  Weigh yourself every day. This is important because it helps to know if you are retaining fluid that may make your heart and lungs work harder.   Use the same scale each time.   Weigh yourself every morning at the same time. You should do this after you go to the bathroom, but before you eat breakfast.   Your weight will be more accurate if you do not wear any clothes.   Record your weight.   Tell your caregiver if you have gained 2 pounds or more overnight.  Activity Stop any activity at once if you have chest pain, shortness of breath, irregular heartbeats, or dizziness. Get help right away if you have any of these symptoms.  Bathing.  Avoid soaking in a bath or hot tub until your incisions are healed.   Rest. You need a balance of rest and activity.   Exercise. Exercise per your caregiver's advice. You may need physical therapy or cardiac rehabilitation to help strengthen your muscles and build your endurance.   Climbing stairs. Unless your caregiver tells you not to climb stairs, go up stairs slowly and rest if you tire. Do not pull yourself up by the handrail.   Driving a car. Follow your caregiver's advice on when you may drive. You may ride as a passenger at any time. When traveling for long periods of time in a car, get out of the car and walk around for a few minutes every 2 hours.   Lifting. Avoid lifting, pushing, or pulling anything heavier than 10 pounds for 6 weeks after surgery or as told by your caregiver.   Returning to work. Check with your caregiver. People heal at different rates. Most people will be able to go back to work 6 to 12 weeks after surgery.   Sexual activity. You may resume sexual relations as told by your caregiver.  SEEK MEDICAL CARE IF:  Any of your incisions are red, painful, or have any type of drainage coming from them.   You have an  oral temperature above 102 F (38.9 C).   You have ankle or leg swelling.   You have pain in your legs.   You have weight gain of 2 or more pounds a day.   You feel dizzy or lightheaded when you stand up.  SEEK IMMEDIATE MEDICAL CARE IF:  You have angina or chest pain that goes to your jaw or arms. Call your local emergency services right away.   You have shortness of breath at rest or with activity.   You have a fast or irregular heartbeat (arrhythmia).   There is a "clicking" in your sternum when you move.   You have numbness or weakness in your arms or legs.    MAKE SURE YOU:  Understand these instructions.   Will watch your condition.   Will get help right away if you are not doing well or get worse.  Document Released: 09/22/2004 Document Revised: 02/22/2011 Document Reviewed: 05/10/2010 [employer] Patient Information 2012 [employer]. 

## 2011-05-25 NOTE — Progress Notes (Signed)
Subjective:  71 year old with severe 3v CAD post CABG. This am went into atrial fibrillation. Asymptomatic. No SOB, no CP.  Objective:  Vital Signs in the last 24 hours: Temp:  [98.6 F (37 C)-99.3 F (37.4 C)] 98.6 F (37 C) (03/08 0442) Pulse Rate:  [58-70] 70  (03/08 0442) Resp:  [15-22] 20  (03/08 0442) BP: (117-182)/(53-67) 147/65 mmHg (03/08 0442) SpO2:  [90 %-100 %] 90 % (03/08 0442) Weight:  [95.6 kg (210 lb 12.2 oz)] 95.6 kg (210 lb 12.2 oz) (03/08 0442)  Intake/Output from previous day: 03/07 0701 - 03/08 0700 In: 1000 [P.O.:960; I.V.:40] Out: 940 [Urine:940]   Physical Exam: General: Well developed, well nourished, in no acute distress. Head:  Normocephalic and atraumatic. Lungs: Clear to auscultation and percussion. Heart:Irreg Irreg, mildly tachycardic.  No murmur, rubs or gallops.  Abdomen: soft, non-tender, positive bowel sounds. Neurologic: Alert and oriented x 3.    Lab Results:  Basename 05/25/11 0600 05/24/11 0415  WBC 10.5 12.6*  HGB 10.0* 10.3*  PLT 139* 129*    Basename 05/25/11 0600 05/24/11 0415  NA 137 136  K 4.3 4.1  CL 104 104  CO2 27 27  GLUCOSE 149* 104*  BUN 17 19  CREATININE 0.74 0.72    Telemetry: Afib 115 this am. Change from SR.  Personally viewed.   EKG:  pending  Assessment/Plan:   AFIB  - on low dose amiodarone 200 BID. Consider increase to 400 BID or TID to load more quickly, BP should allow.   - consider increase metoprolol as well. Currently on 12.5 BID.  - if persistent, anticoagulate.    CAD  - doing well post CABG  NSTEMI  - ASA, statin, Plavix, ACE-I, Bb. Stable.  Will continue to follow.   Eddy Termine 05/25/2011, 8:33 AM

## 2011-05-25 NOTE — Progress Notes (Signed)
Pt walked approx 500 feet. Tolerated well. Used rolling walker. No SOB, no weakness.

## 2011-05-26 LAB — GLUCOSE, CAPILLARY
Glucose-Capillary: 112 mg/dL — ABNORMAL HIGH (ref 70–99)
Glucose-Capillary: 133 mg/dL — ABNORMAL HIGH (ref 70–99)
Glucose-Capillary: 143 mg/dL — ABNORMAL HIGH (ref 70–99)
Glucose-Capillary: 144 mg/dL — ABNORMAL HIGH (ref 70–99)
Glucose-Capillary: 111 mg/dL — ABNORMAL HIGH (ref 70–99)
Glucose-Capillary: 166 mg/dL — ABNORMAL HIGH (ref 70–99)
Glucose-Capillary: 190 mg/dL — ABNORMAL HIGH (ref 70–99)
Glucose-Capillary: 108 mg/dL — ABNORMAL HIGH (ref 70–99)

## 2011-05-26 MED ORDER — LISINOPRIL 10 MG PO TABS
10.0000 mg | ORAL_TABLET | Freq: Every day | ORAL | Status: DC
  Administered 2011-05-26 – 2011-05-27 (×2): 10 mg via ORAL
  Filled 2011-05-26 (×3): qty 1

## 2011-05-26 NOTE — Progress Notes (Signed)
DC'd EPW's per hospital policy. Instructed patient to stay in bed for 1 hour or until 1440. Wires intact and pt tolerated well. RN will continue to monitor patient.

## 2011-05-26 NOTE — Progress Notes (Addendum)
Patient Name: Larry Cooper Date of Encounter: 05/26/2011    SUBJECTIVE: Amiodarone started orally yesterday due to bursts of atrial fibrillation. This morning the patient is maintaining normal sinus rhythm. He has no complaints and is looking forward to discharge.  TELEMETRY:  NSR/sinus bradycardia.: Filed Vitals:   05/25/11 2100 05/25/11 2104 05/26/11 0540 05/26/11 0545  BP:  154/64 163/73   Pulse:  73 68   Temp: 99.1 F (37.3 C) 99.1 F (37.3 C) 97.7 F (36.5 C)   TempSrc:  Oral Oral   Resp:  20 18   Height:      Weight:    92.126 kg (203 lb 1.6 oz)  SpO2:  97% 97%     Intake/Output Summary (Last 24 hours) at 05/26/11 1055 Last data filed at 05/25/11 2100  Gross per 24 hour  Intake      0 ml  Output    300 ml  Net   -300 ml    LABS: Basic Metabolic Panel:  Basename 05/25/11 0600 05/24/11 0415 05/23/11 1700  NA 137 136 --  K 4.3 4.1 --  CL 104 104 --  CO2 27 27 --  GLUCOSE 149* 104* --  BUN 17 19 --  CREATININE 0.74 0.72 --  CALCIUM 8.9 8.4 --  MG -- -- 1.8  PHOS -- -- --   CBC:  Basename 05/25/11 0600 05/24/11 0415  WBC 10.5 12.6*  NEUTROABS -- --  HGB 10.0* 10.3*  HCT 29.5* 31.0*  MCV 85.8 86.8  PLT 139* 129*    Radiology/Studies:  No new studies  Physical Exam: Blood pressure 163/73, pulse 68, temperature 97.7 F (36.5 C), temperature source Oral, resp. rate 18, height 5\' 10"  (1.778 m), weight 92.126 kg (203 lb 1.6 oz), SpO2 97.00%. Weight change: -3.475 kg (-7 lb 10.6 oz)   Incision without drainage.  S4 gallop is heard. No obvious murmur.  Extremities no edema.  ASSESSMENT:  1. Paroxysmal atrial fibrillation  2. Amiodarone loading  3. Status post coronary artery bypass grafting   Plan:  1. Amiodarone loading, after 3-4 days of 800 mg to decrease to 400 mg per day. 200 mg per day after 2 weeks of amiodarone and eventual discontinuation of therapy by Dr. Anne Fu in 4-6 weeks.  Selinda Eon 05/26/2011, 10:55 AM

## 2011-05-26 NOTE — Progress Notes (Addendum)
[address]            [place] [address]          [phone]     4 Days Post-Op  Procedure(s) (LRB): CORONARY ARTERY BYPASS GRAFTING (CABG) (N/A) Subjective Feels well  Objective  Telemetry maintaining sinus rhythm after short episode of afib yesterday  Temp:  [97.7 F (36.5 C)-99.1 F (37.3 C)] 97.7 F (36.5 C) (03/09 0540) Pulse Rate:  [68-73] 68  (03/09 0540) Resp:  [18-20] 18  (03/09 0540) BP: (143-163)/(64-73) 163/73 mmHg (03/09 0540) SpO2:  [96 %-97 %] 97 % (03/09 0540) Weight:  [203 lb 1.6 oz (92.126 kg)] 203 lb 1.6 oz (92.126 kg) (03/09 0545)   Intake/Output Summary (Last 24 hours) at 05/26/11 0932 Last data filed at 05/25/11 2100  Gross per 24 hour  Intake      0 ml  Output    600 ml  Net   -600 ml       General appearance: alert, cooperative and no distress Heart: regular rate and rhythm and S1, S2 normal Lungs: mildly diminished in bases Abdomen: soft, nontender Extremities: minor edema Wound: incisions healing well  Lab Results:  Basename 05/25/11 0600 05/24/11 0415 05/23/11 1700  NA 137 136 --  K 4.3 4.1 --  CL 104 104 --  CO2 27 27 --  GLUCOSE 149* 104* --  BUN 17 19 --  CREATININE 0.74 0.72 --  CALCIUM 8.9 8.4 --  MG -- -- 1.8  PHOS -- -- --   No results found for this basename: AST:2,ALT:2,ALKPHOS:2,BILITOT:2,PROT:2,ALBUMIN:2 in the last 72 hours No results found for this basename: LIPASE:2,AMYLASE:2 in the last 72 hours  Basename 05/25/11 0600 05/24/11 0415  WBC 10.5 12.6*  NEUTROABS -- --  HGB 10.0* 10.3*  HCT 29.5* 31.0*  MCV 85.8 86.8  PLT 139* 129*   No results found for this basename: CKTOTAL:4,CKMB:4,TROPONINI:4 in the last 72 hours No components found with this basename: POCBNP:3 No results found for this basename: DDIMER in the last 72 hours No results found for this basename: HGBA1C in the last 72 hours No results found for this basename: CHOL,HDL,LDLCALC,TRIG,CHOLHDL in the last 72  hours No results found for this basename: TSH,T4TOTAL,FREET3,T3FREE,THYROIDAB in the last 72 hours No results found for this basename: VITAMINB12,FOLATE,FERRITIN,TIBC,IRON,RETICCTPCT in the last 72 hours  Medications: Scheduled    . amiodarone  400 mg Oral Q12H  . aspirin EC  81 mg Oral Daily  . atorvastatin  80 mg Oral q1800  . clopidogrel  75 mg Oral Q breakfast  . docusate sodium  200 mg Oral Daily  . insulin aspart  0-24 Units Subcutaneous TID AC & HS  . insulin aspart  4 Units Subcutaneous TID WC  . insulin glargine  32 Units Subcutaneous Daily  . lisinopril  2.5 mg Oral Daily  . metFORMIN  1,000 mg Oral BID WC  . metoprolol tartrate  25 mg Oral BID  . pantoprazole  40 mg Oral QAC breakfast  . sodium chloride  3 mL Intravenous Q12H  . thiamine  100 mg Oral Daily     Radiology/Studies:  No results found.  INR: Will add last result for INR, ABG once components are confirmed Will add last 4 CBG results once components are confirmed  Assessment/Plan: S/P Procedure(s) (LRB): CORONARY ARTERY BYPASS GRAFTING (CABG) (N/A)  1. Doing well, cont to monitor rhythm.  Increase lisinopril for hypertension D/c epw's today, if no new issues prob home in am 2. Push rehab/pulm toilet 3. cbg 111-190 on current rx, cont lantus/metformin  LOS: 7 days    GOLD,WAYNE E 3/9/20139:32 AM    I have seen and examined the patient and agree with the assessment and plan as outlined.  Tentatively for d/c home tomorrow.  Reagen Haberman H 05/26/2011 10:51 AM

## 2011-05-27 LAB — GLUCOSE, CAPILLARY: Glucose-Capillary: 80 mg/dL (ref 70–99)

## 2011-05-27 MED ORDER — LISINOPRIL 10 MG PO TABS: 10.0000 mg | ORAL_TABLET | Freq: Every day | ORAL | Status: DC

## 2011-05-27 NOTE — Progress Notes (Signed)
                   [address]            [place] [address]          [phone]     5 Days Post-Op  Procedure(s) (LRB): CORONARY ARTERY BYPASS GRAFTING (CABG) (N/A) Subjective: Feels well  Objective  Telemetry NSR/ Sinus brady   Temp:  [98.2 F (36.8 C)-98.8 F (37.1 C)] 98.2 F (36.8 C) (03/10 0510) Pulse Rate:  [59-90] 59  (03/10 0510) Resp:  [18-19] 19  (03/10 0510) BP: (137-155)/(60-78) 147/78 mmHg (03/10 0510) SpO2:  [97 %-100 %] 97 % (03/10 0510) Weight:  [202 lb 8 oz (91.853 kg)] 202 lb 8 oz (91.853 kg) (03/10 0510)   Intake/Output Summary (Last 24 hours) at 05/27/11 0833 Last data filed at 05/26/11 2200  Gross per 24 hour  Intake    483 ml  Output      0 ml  Net    483 ml       General appearance: alert and no distress Heart: regular rate and rhythm and S1, S2 normal Lungs: clear to auscultation bilaterally Abdomen: soft, non-tender; bowel sounds normal; no masses,  no organomegaly Extremities: minor LE edema Wound: incisions healing well  Lab Results:  Basename 05/25/11 0600  NA 137  K 4.3  CL 104  CO2 27  GLUCOSE 149*  BUN 17  CREATININE 0.74  CALCIUM 8.9  MG --  PHOS --   No results found for this basename: AST:2,ALT:2,ALKPHOS:2,BILITOT:2,PROT:2,ALBUMIN:2 in the last 72 hours No results found for this basename: LIPASE:2,AMYLASE:2 in the last 72 hours  Basename 05/25/11 0600  WBC 10.5  NEUTROABS --  HGB 10.0*  HCT 29.5*  MCV 85.8  PLT 139*   No results found for this basename: CKTOTAL:4,CKMB:4,TROPONINI:4 in the last 72 hours No components found with this basename: POCBNP:3 No results found for this basename: DDIMER in the last 72 hours No results found for this basename: HGBA1C in the last 72 hours No results found for this basename: CHOL,HDL,LDLCALC,TRIG,CHOLHDL in the last 72 hours No results found for this basename: TSH,T4TOTAL,FREET3,T3FREE,THYROIDAB in the last 72 hours No results found for this basename:  VITAMINB12,FOLATE,FERRITIN,TIBC,IRON,RETICCTPCT in the last 72 hours  Medications: Scheduled    . amiodarone  400 mg Oral Q12H  . aspirin EC  81 mg Oral Daily  . atorvastatin  80 mg Oral q1800  . clopidogrel  75 mg Oral Q breakfast  . docusate sodium  200 mg Oral Daily  . insulin aspart  0-24 Units Subcutaneous TID AC & HS  . insulin aspart  4 Units Subcutaneous TID WC  . insulin glargine  32 Units Subcutaneous Daily  . lisinopril  10 mg Oral Daily  . metFORMIN  1,000 mg Oral BID WC  . metoprolol tartrate  25 mg Oral BID  . pantoprazole  40 mg Oral QAC breakfast  . sodium chloride  3 mL Intravenous Q12H  . thiamine  100 mg Oral Daily  . DISCONTD: lisinopril  2.5 mg Oral Daily     Radiology/Studies:  No results found.  INR: Will add last result for INR, ABG once components are confirmed Will add last 4 CBG results once components are confirmed  Assessment/Plan: 1. Doing well, stable for discharge 2. BP better  LOS: 8 days    Ji Feldner E 3/10/20138:33 AM

## 2011-05-27 NOTE — Progress Notes (Signed)
I spoke to the patient concerning amiodarone. I will alert Dr. Anne Fu of the patient's current therapy and the need to wean and discontinue this medication over the next several weeks.

## 2011-05-27 NOTE — Discharge Summary (Signed)
[address]            [place] [address]          [phone]      PETRA SARGEANT 07-30-1940 71 y.o. [id number]  05/19/2011 05/27/2011  No att. providers found  NSTEMI (non-ST elevated myocardial infarction) [410.70] (Coronary Artery Bypass Grafting) Hyperglycemia without ketosis [790.29] chest pain cp CAD   HPI:  This is a 70 y.o. male with a history of multiple cardiac risk factors including cigarette use and diabetes. He has previous history of coronary artery disease including myocardial infarction as well as placement of multiple previous stents. The last one was placed 3 years ago in [facility]. On the date of admission he developed left-sided cyst substernal chest discomfort associated with shortness of breath. This felt similar to his previous cardiac pain. He presented to the emergency department where he was placed on intravenous heparin and nitroglycerin. His EKG showed sinus rhythm without any ST segment changes. His blood sugars were are significantly elevated. His initial troponin I was 0.92 and positive. He is admitted for further evaluation and treatment to include cardiac catheterization. Prior to Admit Meds: Aspirin 325,  70/30 insulins-20 units 3 times a day  metformin 1000 mg twice a day,  metoprolol 100 mg twice a day,  AcipHex 20 mg 2 times a day,  vitamin C, vitamin D 6, vitamin B12.   Hospital Course:  The patient was admitted under the care and management of Dr. Donato Schultz. Echocardiogram done 05/20/2011 showed normal ejection fraction. He was stabilized medically and taken to the cardiac catheterization lab on 05/21/2011 and the following was noted:  PROCEDURE: Left heart catheterization with selective coronary angiography, left ventriculogram via the radial artery approach.  INDICATIONS: 71 year old male with non-ST elevation myocardial infarction, troponin of 4, normal left ventricular ejection fraction by  echocardiogram, coronary artery disease with previously placed stents in the LAD and circumflex artery over the past 10 years, diabetes, ongoing tobacco use here for cardiac catheterization.  The risks, benefits, and details of the procedure were explained to the patient, including possibilities of stroke, heart attack, death, renal impairment, arterial damage, bleeding. The patient verbalized understanding and wanted to proceed. Informed written consent was obtained.  PROCEDURE TECHNIQUE: Omario's test was performed pre-and post procedure and was normal. The right radial artery site was prepped and draped in a sterile fashion. One percent lidocaine was used for local anesthesia. Using the modified Seldinger technique a 5 French hydrophilic sheath was inserted into the radial artery without difficulty. 3 mg of verapamil was administered via the sheath. A Judkins right #4 catheter with the guidance of a Versicore wire was placed in the right coronary cusp and selectively cannulated the right coronary artery. After traversing the aortic arch, 4000 units of heparin IV was administered. A Judkins left #3.5 catheter was used to selectively cannulate the left main artery. Multiple views with hand injection of Omnipaque were obtained. Catheter a pigtail catheter was used to cross into the left ventricle, hemodynamics were obtained, and a left ventriculogram was performed in the RAO position with power injection. Following the procedure, sheath was removed, patient was hemodynamically stable, hemostasis was maintained with a Terumo T band.  CONTRAST: Total of 110 ml.  FLOUROSCOPY TIME: 4.5 min.  COMPLICATIONS: None.  HEMODYNAMICS: Aortic pressure was 151/76 with a mean of 107 mmHg; LV systolic pressure  was 151 mmHg; LVEDP 16 mmHg. There was no gradient between the left ventricle and aorta.  ANGIOGRAPHIC DATA:  Left main: There is mild tapering/stenosis in the distal left main of up to 30%, this bifurcates into the  LAD as well as circumflex artery.  Left anterior descending (LAD): There is a long segment of LAD stenting from the proximal to mid segment encompassing the second and third diagonal branch. The first diagonal branch is quite small in caliber. Within the mid to distal segment of the previously placed stent there is up to 90% in-stent stenosis at the bifurcation of the third diagonal. There is also distal LAD stent with a step down/stenosis of up to 80% just distal to the previously placed stent placement. The second and third diagonal both have ostial/proximal disease of up to 70-80%.  Circumflex artery (CIRC): There is a high first obtuse marginal branch which is moderate in caliber with a tented mid segment but otherwise only mild diffuse disease throughout this vessel. The second obtuse marginal has a previously placed stent in its mid segment. Proximal to the stent however there is a 95% tubular stenosis.  Right coronary artery (RCA): This artery is diffusely diseased especially in its distal segment. There does not appear to be any previously placed stents in this vessel. The proximal segment has approximately 70% stenosis, diffuse. There significant stenosis at the bifurcation of the second acute marginal branch. The right coronary gives rise to the posterior descending artery which appears widely patent with mild to moderate diffuse disease.  LEFT VENTRICULOGRAM: Left ventricular angiogram was done in the 30 RAO projection and revealed normal left ventricular wall motion and systolic function with an estimated ejection fraction of 70 %.  IMPRESSIONS:  1. Significant three-vessel coronary artery disease as follows: Proximal right coronary artery of up to 70%, mid LAD in-stent stenosis of up to 90%, second and third diagonal stenosis of 70-80%, first obtuse marginal stenosis proximal to previously placed stent of up to 90%.  2. Normal left ventricular systolic function. LVEDP 18 mmHg. Ejection fraction  70 %. RECOMMENDATION: I have discussed films with Dr.Varanasi and given the diffuse nature of his disease, I would like to proceed with a surgical consult given his diabetes and triple vessel disease. I do understand that surgical revascularization may be limited by previously placed stents. I will seek their opinion and expertise. I have discussed with Mr. Kirk. He was then seen in cardiothoracic surgical consultation with Dr. Sheliah Plane who evaluated the patient and his studies and agreed with recommendations for surgical revascularization. 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  Radiology has recommended a CT to further evaluate the extent of his lung disease. Will obtain a noncontrasted CT scan of the chest preoperatively.  His coronary artery disease is problematic, with current medical therapy is presented again with a myocardial infarction. Further stenting is not an option. I've reviewed the films and discussed the pros and cons of coronary artery bypass grafting versus medical therapy with the patient and his daughter. He is aware that his  of increased risk of bypass surgery because of poor quality distal vessels ongoing tobacco use and diabetes. However with medical therapy alone is very likely he will present again just as he did Saturday.   On 05/23/2011 he then underwent the following procedure:  OPERATIVE REPORT  PREOPERATIVE DIAGNOSIS: Coronary occlusive disease with recent non-ST-  segment elevation  myocardial infarction.  POSTOPERATIVE DIAGNOSIS: Coronary occlusive disease with recent non-ST-  segment elevation myocardial infarction.  SURGICAL PROCEDURE: Coronary artery bypass grafting x6 with the left  internal mammary to the very distal left anterior descending coronary  artery, reverse saphenous vein graft to the first diagonal, sequential  reverse saphenous vein graft to the ramus and distal circumflex, reverse  saphenous vein graft to the posterior descending and posterior lateral  branches of the right coronary artery. Biopsy of left pleural plaques  and incidental left mammary artery chain lymph node with right thigh and  leg endo-vein harvesting.  SURGEON: Sheliah Plane, MD  FIRST ASSISTANT: Doree Fudge, PA  He tolerated the procedure well and was taken to the surgical intensive care unit in stable condition. He did have some intraoperative ventricular arrhythmias and was started on amiodarone during the procedure.   Postoperative hospital course: Overall he has progressed quite nicely. He was weaned from the ventilator without difficulty. He has maintained stable hemodynamics. He did have postoperative atrial fibrillation and amiodarone dose was increased as well as Lopressor. Currently he maintains normal sinus rhythm. All routine lines, monitors, drainage devices have been discontinued in the standard fashion. Blood sugars are under adequate control and the plan at this time is for him to use Lantus insulin at home. Of note his hemoglobin A1 C. was 8.0. Incisions are all healing well without evidence of infection. He is tolerating gradually increasing activities using standard protocols. He does have expected acute blood loss anemia with values have stabilized. He did have some postoperative volume overload but has responded well to diuretics. Oxygen has been weaned and he maintains adequate saturations on room air. Overall his status is felt to be stable for discharge on  today's date.   Basename 05/25/11 0600  NA 137  K 4.3  CL 104  CO2 27  GLUCOSE 149*  BUN 17  CALCIUM 8.9    Basename 05/25/11 0600  WBC 10.5  HGB 10.0*  HCT 29.5*  PLT 139*   No results found for this basename: INR:2 in the last 72 hours   Discharge Instructions:  The patient is discharged to home with extensive instructions on wound care and progressive ambulation.  They are instructed not to drive or perform any heavy lifting until returning to see the physician in his office.  Discharge Diagnosis:  NSTEMI (non-ST elevated myocardial infarction) [410.70] (Coronary Artery Bypass Grafting) Hyperglycemia without ketosis [790.29] chest pain cp CAD Acute blood loss anemia Secondary Diagnosis: Patient Active Problem List  Diagnoses  . Coronary atherosclerosis of native coronary artery  . Acute myocardial infarction, subendocardial infarction, initial episode of care  . Type II or unspecified type diabetes mellitus without mention of complication, not stated as uncontrolled  . H/O: CVA (cardiovascular accident)  . Tobacco abuse  . Asbestosis   Past Medical History  Diagnosis Date  . Diabetes mellitus   . Stroke   . Myocardial infarct   . Alcohol abuse   . Coronary artery disease   . Asbestosis        Donyel, Nester  Home Medication Instructions [id number]   Printed on:05/27/11 [phone]  Medication Information                    RABEprazole (ACIPHEX) 20 MG tablet Take 20 mg by mouth 2 (two) times daily.            metFORMIN (GLUCOPHAGE) 1000 MG tablet Take 1,000 mg by mouth 2 (two) times daily with a meal.           Ascorbic Acid (VITAMIN C PO) Take 1 tablet by mouth daily.           Pyridoxine HCl (VITAMIN B-6 PO) Take 1 tablet by mouth daily.           Cyanocobalamin (VITAMIN B 12 PO) Take 1 tablet by mouth daily.           Multiple Vitamins-Minerals (ZINC PO) Take 1 tablet by mouth daily.           amiodarone (PACERONE) 400 MG tablet Take 1  tablet (400 mg total) by mouth every 12 (twelve) hours. For 7 days, then take 400 mg daily           aspirin EC 81 MG EC tablet Take 1 tablet (81 mg total) by mouth daily.           atorvastatin (LIPITOR) 80 MG tablet Take 1 tablet (80 mg total) by mouth daily at 6 PM.           clopidogrel (PLAVIX) 75 MG tablet Take 1 tablet (75 mg total) by mouth daily with breakfast.           insulin glargine (LANTUS) 100 UNIT/ML injection Inject 32 Units into the skin daily.           metoprolol tartrate (LOPRESSOR) 25 MG tablet Take 1 tablet (25 mg total) by mouth 2 (two) times daily.           oxyCODONE (OXY IR/ROXICODONE) 5 MG immediate release tablet Take 1-2 tablets (5-10 mg total) by mouth every 4 (four) hours as needed for pain.           thiamine 100 MG tablet Take 1 tablet (100 mg total) by mouth daily.           lisinopril (PRINIVIL,ZESTRIL) 10 MG tablet Take 1 tablet (10 mg total) by mouth daily.             Disposition: Discharged home  Patient's condition is Good  Gershon Crane, PA-C 05/27/2011  11:27 AM

## 2011-05-27 NOTE — Progress Notes (Signed)
DC'd patient to home with girlfriend.  RN reviewed dc instructions and med rec with pt.  Pt and girlfriend verbalized understanding of follow up appointments with MD.  RN dc'd IV with catheter intact.

## 2011-05-28 MED FILL — Sodium Chloride Irrigation Soln 0.9%: Qty: 3000 | Status: AC

## 2011-05-28 MED FILL — Sodium Bicarbonate IV Soln 8.4%: INTRAVENOUS | Qty: 50 | Status: AC

## 2011-05-28 MED FILL — Heparin Sodium (Porcine) Inj 1000 Unit/ML: INTRAMUSCULAR | Qty: 30 | Status: AC

## 2011-05-28 MED FILL — Electrolyte-R (PH 7.4) Solution: INTRAVENOUS | Qty: 6000 | Status: AC

## 2011-05-28 MED FILL — Sodium Chloride IV Soln 0.9%: INTRAVENOUS | Qty: 1000 | Status: AC

## 2011-05-28 MED FILL — Lidocaine HCl IV Inj 20 MG/ML: INTRAVENOUS | Qty: 10 | Status: AC

## 2011-05-28 MED FILL — Mannitol IV Soln 20%: INTRAVENOUS | Qty: 500 | Status: AC

## 2011-05-28 MED FILL — Heparin Sodium (Porcine) Inj 1000 Unit/ML: INTRAMUSCULAR | Qty: 20 | Status: AC

## 2011-05-31 ENCOUNTER — Encounter: Payer: Self-pay | Admitting: Cardiothoracic Surgery

## 2011-05-31 ENCOUNTER — Ambulatory Visit: Payer: Medicare Other | Admitting: Family Medicine

## 2011-06-05 ENCOUNTER — Other Ambulatory Visit: Payer: Self-pay | Admitting: Cardiothoracic Surgery

## 2011-06-05 DIAGNOSIS — I2510 Atherosclerotic heart disease of native coronary artery without angina pectoris: Secondary | ICD-10-CM

## 2011-06-11 ENCOUNTER — Ambulatory Visit (INDEPENDENT_AMBULATORY_CARE_PROVIDER_SITE_OTHER): Payer: Self-pay | Admitting: Surgical

## 2011-06-11 ENCOUNTER — Ambulatory Visit
Admission: RE | Admit: 2011-06-11 | Discharge: 2011-06-11 | Disposition: A | Discharge status: Home / Self Care | Payer: Medicare PPO | Source: Ambulatory Visit | Attending: Cardiothoracic Surgery | Admitting: Cardiothoracic Surgery

## 2011-06-11 VITALS — BP 180/85 | HR 52 | Resp 20 | Ht 69.0 in | Wt 198.0 lb

## 2011-06-11 DIAGNOSIS — I10 Essential (primary) hypertension: Secondary | ICD-10-CM

## 2011-06-11 DIAGNOSIS — I2510 Atherosclerotic heart disease of native coronary artery without angina pectoris: Secondary | ICD-10-CM

## 2011-06-11 DIAGNOSIS — Z951 Presence of aortocoronary bypass graft: Secondary | ICD-10-CM

## 2011-06-11 MED ORDER — OXYCODONE-ACETAMINOPHEN 5-325 MG PO TABS: 1.0000 | ORAL_TABLET | Freq: Four times a day (QID) | ORAL | Status: DC | PRN

## 2011-06-11 MED ORDER — LISINOPRIL 20 MG PO TABS: 20.0000 mg | ORAL_TABLET | Freq: Every day | ORAL | Status: DC

## 2011-06-11 NOTE — Progress Notes (Signed)
  HPI: The patient is seen today in routine followup from his 05/23/2011 coronary bypass grafting. He is doing quite well. He does have some minor sternal incision discomfort which is improving. Additionally he does have some difficulty sleeping at night . He is not having any fevers, chills, or other constitutional complaints/symptoms. Overall he feels as though he is making good progress.   Current Outpatient Prescriptions  Medication Sig Dispense Refill  . amiodarone (PACERONE) 400 MG tablet Take 1 tablet (400 mg total) by mouth every 12 (twelve) hours. For 7 days, then take 400 mg daily  70 tablet  1  . Ascorbic Acid (VITAMIN C PO) Take 1 tablet by mouth daily.      Marland Kitchen aspirin EC 81 MG EC tablet Take 1 tablet (81 mg total) by mouth daily.      Marland Kitchen atorvastatin (LIPITOR) 80 MG tablet Take 1 tablet (80 mg total) by mouth daily at 6 PM.  30 tablet  1  . clopidogrel (PLAVIX) 75 MG tablet Take 1 tablet (75 mg total) by mouth daily with breakfast.  30 tablet  1  . Cyanocobalamin (VITAMIN B 12 PO) Take 1 tablet by mouth daily.      . Insulin Aspart Prot & Aspart (NOVOLOG MIX 70/30 [place]) Inject 20 Units into the skin 2 (two) times daily. 20-30 units [place] BID      . lisinopril (PRINIVIL,ZESTRIL) 10 MG tablet Take 20 mg by mouth daily.      . metFORMIN (GLUCOPHAGE) 1000 MG tablet Take 500 mg by mouth 2 (two) times daily with a meal.       . metoprolol tartrate (LOPRESSOR) 25 MG tablet Take 1 tablet (25 mg total) by mouth 2 (two) times daily.  60 tablet  1  . Multiple Vitamins-Minerals (ZINC PO) Take 1 tablet by mouth daily.      Marland Kitchen oxycodone (OXY-IR) 5 MG capsule Take 5 mg by mouth every 4 (four) hours as needed.      . Pyridoxine HCl (VITAMIN B-6 PO) Take 1 tablet by mouth daily.      . insulin glargine (LANTUS) 100 UNIT/ML injection Inject 32 Units into the skin daily.  10 mL  6  . lisinopril (PRINIVIL,ZESTRIL) 20 MG tablet Take 20 mg by mouth daily.      Marland Kitchen oxyCODONE (OXY IR/ROXICODONE) 5 MG immediate  release tablet Take 1-2 tablets (5-10 mg total) by mouth every 4 (four) hours as needed for pain.  50 tablet  0  . RABEprazole (ACIPHEX) 20 MG tablet Take 20 mg by mouth 2 (two) times daily.       Marland Kitchen thiamine 100 MG tablet Take 1 tablet (100 mg total) by mouth daily.  30 tablet  1    Physical Exam blood pressure 190/90 heart rate 59 Gen.-well-developed adult male in no acute distress Pulmonary-clear lung fields throughout Cardiac-normal S1-S2, regular rate and rhythm Abdominal exam-benign Extremities 1+ edema right lower extremity, trace edema left lower extremity Incision-healing well without evidence of infection Diagnostic Tests:   Impression: The patient is doing quite well.  Plan: Due to his elevated blood pressure, I will increase his lisinopril from 10 mg to 20 mg daily. He is scheduled to see his cardiologist on April 8. Additionally, we'll give him a new prescription for pain medication. We will see him again on a when necessary basis for any surgically related issues or at request. Discussed returning to driving protocols and ongoing lifting restrictions.

## 2011-08-09 ENCOUNTER — Other Ambulatory Visit: Payer: Self-pay | Admitting: Surgical

## 2011-09-13 ENCOUNTER — Other Ambulatory Visit: Payer: Self-pay | Admitting: Surgical

## 2011-10-05 ENCOUNTER — Emergency Department (HOSPITAL_COMMUNITY)
Admission: EM | Admit: 2011-10-05 | Discharge: 2011-10-05 | Disposition: A | Discharge status: Home / Self Care | Payer: Medicare PPO | Attending: Emergency Medicine | Admitting: Emergency Medicine

## 2011-10-05 ENCOUNTER — Encounter (HOSPITAL_COMMUNITY): Payer: Self-pay

## 2011-10-05 ENCOUNTER — Emergency Department (HOSPITAL_COMMUNITY): Payer: Medicare PPO

## 2011-10-05 DIAGNOSIS — Z8673 Personal history of transient ischemic attack (TIA), and cerebral infarction without residual deficits: Secondary | ICD-10-CM | POA: Insufficient documentation

## 2011-10-05 DIAGNOSIS — E119 Type 2 diabetes mellitus without complications: Secondary | ICD-10-CM | POA: Insufficient documentation

## 2011-10-05 DIAGNOSIS — Z79899 Other long term (current) drug therapy: Secondary | ICD-10-CM | POA: Insufficient documentation

## 2011-10-05 DIAGNOSIS — M25519 Pain in unspecified shoulder: Secondary | ICD-10-CM

## 2011-10-05 DIAGNOSIS — I2510 Atherosclerotic heart disease of native coronary artery without angina pectoris: Secondary | ICD-10-CM | POA: Insufficient documentation

## 2011-10-05 DIAGNOSIS — R52 Pain, unspecified: Secondary | ICD-10-CM | POA: Insufficient documentation

## 2011-10-05 DIAGNOSIS — I252 Old myocardial infarction: Secondary | ICD-10-CM | POA: Insufficient documentation

## 2011-10-05 DIAGNOSIS — Z951 Presence of aortocoronary bypass graft: Secondary | ICD-10-CM | POA: Insufficient documentation

## 2011-10-05 DIAGNOSIS — Z794 Long term (current) use of insulin: Secondary | ICD-10-CM | POA: Insufficient documentation

## 2011-10-05 DIAGNOSIS — Z87891 Personal history of nicotine dependence: Secondary | ICD-10-CM | POA: Insufficient documentation

## 2011-10-05 MED ORDER — OXYCODONE-ACETAMINOPHEN 5-325 MG PO TABS: 1.0000 | ORAL_TABLET | Freq: Four times a day (QID) | ORAL | Status: AC | PRN

## 2011-10-05 MED ORDER — OXYCODONE-ACETAMINOPHEN 5-325 MG PO TABS
1.0000 | ORAL_TABLET | Freq: Once | ORAL | Status: AC
  Administered 2011-10-05: 1 via ORAL
  Filled 2011-10-05: qty 1

## 2011-10-05 NOTE — ED Notes (Signed)
Complain of generalized pain. States shoulders hurt worse

## 2011-10-05 NOTE — ED Notes (Addendum)
C/o bilateral shoulder pain and knee pain for the past month or two; reports pain more severe in shoulders, with right being worse than left; denies injury, but does reports "old injury" to right shoulder. Pt reports taking ibuprofen at home with no relief.  States the pain has now begun to interfere with his sleep; also c/o pain to left hand-left fourth finger retracted in a bent position, and pt states he is unable to move it from this position x 1 month.

## 2011-10-05 NOTE — ED Provider Notes (Signed)
History  This chart was scribed for Shelda Jakes, MD by Gerlean Ren. This patient was seen in room APA08/APA08 and the patient's care was started at 8:03AM.  CSN: [id number]  Arrival date & time 10/05/11  4098   First MD Initiated Contact with Patient 10/05/11 [phone]      Chief Complaint  Patient presents with  . Shoulder Pain     Patient is a 71 y.o. male presenting with shoulder pain. The history is provided by the patient. No language interpreter was used.  Shoulder Pain This is a chronic problem. The current episode started more than 1 week ago. The problem occurs constantly. The problem has been gradually worsening. Pertinent negatives include no chest pain, no abdominal pain, no headaches and no shortness of breath. Nothing relieves the symptoms. He has tried nothing for the symptoms.    Larry Cooper is a 71 y.o. male who presents to the Emergency Department complaining 2 weeks of gradually worsening, constant, severe right shoulder pain with associated 2 weeks of limited ROM.  Pt reports that right shoulder has been a chronic problem due to a 71 year old unspecified injury, but that pain is currently unusually severe.  He denies any new injuries. Pt also c/o global arthritis with specific mention of hand joint pain. The pains are worse with use of the joints and improved with rest. He denies taking OTC medications at home to improve symptoms.  He denies fever, neck pain, sore throat, visual disturbance, CP, cough, SOB, abdominal pain, nausea, emesis, diarrhea, urinary symptoms, back pain, HA, weakness, numbness and rash as associated symptoms.  He has a h/o DM, CVA, MI and CAD. He is an occasional alcohol user and former smoker.  No PCP for chronic pain.  Past Medical History  Diagnosis Date  . Diabetes mellitus   . Stroke   . Myocardial infarct   . Alcohol abuse   . Coronary artery disease   . Asbestosis     Past Surgical History  Procedure Date  . Heart stents   .  Appendectomy   . Coronary artery bypass graft 05/22/2011    Procedure: CORONARY ARTERY BYPASS GRAFTING (CABG);  Surgeon: Delight Ovens, MD;  Location: [facility] OR;  Service: Open Heart Surgery;  Laterality: N/A;  Times 6 using endoscopically harvested right greater saphenous vein and left internal mammory artery.     No family history on file.  History  Substance Use Topics  . Smoking status: Former Smoker -- 1.0 packs/day    Types: Cigarettes    Quit date: 05/18/2011  . Smokeless tobacco: Never Used  . Alcohol Use: Yes      Review of Systems  Constitutional: Negative for fever.  Respiratory: Negative for cough and shortness of breath.   Cardiovascular: Negative for chest pain.  Gastrointestinal: Negative for vomiting and abdominal pain.  Musculoskeletal: Positive for arthralgias. Negative for back pain.  Skin: Negative for rash.  Neurological: Negative for numbness and headaches.    Allergies  Codeine  Home Medications   Current Outpatient Rx  Name Route Sig Dispense Refill  . AMIODARONE HCL 400 MG PO TABS Oral Take 1 tablet (400 mg total) by mouth every 12 (twelve) hours. For 7 days, then take 400 mg daily 70 tablet 1  . VITAMIN C PO Oral Take 1 tablet by mouth daily.    . ASPIRIN 81 MG PO TBEC Oral Take 1 tablet (81 mg total) by mouth daily.    . ATORVASTATIN CALCIUM 80  MG PO TABS Oral Take 1 tablet (80 mg total) by mouth daily at 6 PM. 30 tablet 1  . CLOPIDOGREL BISULFATE 75 MG PO TABS Oral Take 1 tablet (75 mg total) by mouth daily with breakfast. 30 tablet 1  . VITAMIN B 12 PO Oral Take 1 tablet by mouth daily.    Marland Kitchen NOVOLOG MIX 70/30 [place] Subcutaneous Inject 20 Units into the skin 2 (two) times daily. 20-30 units [place] BID    . INSULIN GLARGINE 100 UNIT/ML  SOLN Subcutaneous Inject 32 Units into the skin daily. 10 mL 6  . LISINOPRIL 20 MG PO TABS Oral Take 1 tablet (20 mg total) by mouth daily. 30 tablet 11  . METFORMIN HCL 1000 MG PO TABS Oral Take 500 mg by mouth 2 (two)  times daily with a meal.     . METOPROLOL TARTRATE 25 MG PO TABS Oral Take 1 tablet (25 mg total) by mouth 2 (two) times daily. 60 tablet 1  . ZINC PO Oral Take 1 tablet by mouth daily.    . OXYCODONE HCL 5 MG PO TABS Oral Take 1-2 tablets (5-10 mg total) by mouth every 4 (four) hours as needed for pain. 50 tablet 0  . OXYCODONE HCL 5 MG PO CAPS Oral Take 5 mg by mouth every 4 (four) hours as needed.    . OXYCODONE-ACETAMINOPHEN 5-325 MG PO TABS Oral Take 1-2 tablets by mouth every 6 (six) hours as needed for pain. 15 tablet 0  . OXYCODONE-ACETAMINOPHEN 5-325 MG PO TABS Oral Take 1 tablet by mouth every 6 (six) hours as needed for pain. 40 tablet 0  . VITAMIN B-6 PO Oral Take 1 tablet by mouth daily.    Marland Kitchen RABEPRAZOLE SODIUM 20 MG PO TBEC Oral Take 20 mg by mouth 2 (two) times daily.     . THIAMINE HCL 100 MG PO TABS Oral Take 1 tablet (100 mg total) by mouth daily. 30 tablet 1    Triage vitals: Pulse 48  Temp 98.3 F (36.8 C) (Oral)  Resp 18  Ht 5\' 10"  (1.778 m)  Wt 190 lb (86.183 kg)  BMI 27.26 kg/m2  SpO2 99%  Physical Exam  Nursing note and vitals reviewed. Constitutional: He is oriented to person, place, and time. He appears well-developed and well-nourished. No distress.  HENT:  Head: Normocephalic and atraumatic.  Mouth/Throat: Oropharynx is clear and moist.  Eyes: Conjunctivae and EOM are normal.  Neck: Neck supple. No tracheal deviation present.  Cardiovascular: Normal rate, regular rhythm and normal heart sounds.   No murmur heard. Pulmonary/Chest: Effort normal and breath sounds normal. No respiratory distress.  Abdominal: Soft. Bowel sounds are normal. There is no tenderness.  Musculoskeletal: He exhibits no edema.       Right shoulder: He exhibits decreased range of motion and pain. He exhibits no swelling and no deformity.       Pain with abduction of right shoulder.  Right radial pulse 2+.  No swelling in shoulder, no obvious deformity. Ring finger on left hand is  fully flexed, tendons appear normal.  Left radial pulse 2+.    Neurological: He is alert and oriented to person, place, and time. No cranial nerve deficit.  Skin: Skin is warm and dry.  Psychiatric: He has a normal mood and affect. His behavior is normal.    ED Course  Procedures (including critical care time)  DIAGNOSTIC STUDIES: Oxygen Saturation is 99% on roomair, normal by my interpretation.    COORDINATION OF CARE:  9:03AM-Discussed treatment plan which includes an orthopedic referral for right shoulder and prescription for pain medications with pt at bedside and pt agreed to plan.     Labs Reviewed - No data to display Dg Shoulder Right  10/05/2011  *RADIOLOGY REPORT*  Clinical Data: Chronic pain with recent worsening  RIGHT SHOULDER - 2+ VIEW  Comparison: Previous chest radiography  Findings: There is chronic degenerative arthritis at the glenohumeral joint with joint space narrowing, osteophyte formation and subchondral cysts.  Distance between the humeral head and the acromial arch is normal.  The AC joint is normal.  Regional ribs appear unremarkable.  IMPRESSION: Pronounced osteoarthritis of the glenohumeral joint.  Original Report Authenticated By: Thomasenia Sales, M.D.     1. Shoulder pain, acute       MDM  Right shoulder with increased pain with abduction suspect rotator cuff injury x-rays consistent with marked arthritis long-standing in that shoulder this is probably contributing. Patient we treated with a sling and follow up with orthopedics and pain medicine. By x-ray no evidence of dislocation or fracture.     I personally performed the services described in this documentation, which was scribed in my presence. The recorded information has been reviewed and considered.     Shelda Jakes, MD 10/05/11 [phone]

## 2011-10-05 NOTE — ED Notes (Signed)
Instructions/prescriptions reviewed and f/u information provided.  Verbalizes understanding.

## 2012-01-25 ENCOUNTER — Observation Stay (HOSPITAL_COMMUNITY): Payer: Medicare PPO

## 2012-01-25 ENCOUNTER — Emergency Department (HOSPITAL_COMMUNITY): Payer: Medicare PPO

## 2012-01-25 ENCOUNTER — Observation Stay (HOSPITAL_COMMUNITY)
Admission: EM | Admit: 2012-01-25 | Discharge: 2012-01-27 | Disposition: A | Discharge status: Home / Self Care | Payer: Medicare PPO | Attending: Family Medicine | Admitting: Family Medicine

## 2012-01-25 ENCOUNTER — Encounter (HOSPITAL_COMMUNITY): Payer: Self-pay | Admitting: Emergency Medicine

## 2012-01-25 DIAGNOSIS — I639 Cerebral infarction, unspecified: Secondary | ICD-10-CM

## 2012-01-25 DIAGNOSIS — I2510 Atherosclerotic heart disease of native coronary artery without angina pectoris: Secondary | ICD-10-CM | POA: Insufficient documentation

## 2012-01-25 DIAGNOSIS — Z9119 Patient's noncompliance with other medical treatment and regimen: Secondary | ICD-10-CM | POA: Insufficient documentation

## 2012-01-25 DIAGNOSIS — R531 Weakness: Secondary | ICD-10-CM

## 2012-01-25 DIAGNOSIS — I214 Non-ST elevation (NSTEMI) myocardial infarction: Secondary | ICD-10-CM

## 2012-01-25 DIAGNOSIS — E785 Hyperlipidemia, unspecified: Secondary | ICD-10-CM | POA: Insufficient documentation

## 2012-01-25 DIAGNOSIS — J61 Pneumoconiosis due to asbestos and other mineral fibers: Secondary | ICD-10-CM

## 2012-01-25 DIAGNOSIS — Z720 Tobacco use: Secondary | ICD-10-CM

## 2012-01-25 DIAGNOSIS — Z951 Presence of aortocoronary bypass graft: Secondary | ICD-10-CM | POA: Insufficient documentation

## 2012-01-25 DIAGNOSIS — R269 Unspecified abnormalities of gait and mobility: Secondary | ICD-10-CM | POA: Insufficient documentation

## 2012-01-25 DIAGNOSIS — R29898 Other symptoms and signs involving the musculoskeletal system: Secondary | ICD-10-CM | POA: Insufficient documentation

## 2012-01-25 DIAGNOSIS — I6330 Cerebral infarction due to thrombosis of unspecified cerebral artery: Principal | ICD-10-CM | POA: Insufficient documentation

## 2012-01-25 DIAGNOSIS — Z8673 Personal history of transient ischemic attack (TIA), and cerebral infarction without residual deficits: Secondary | ICD-10-CM | POA: Insufficient documentation

## 2012-01-25 DIAGNOSIS — R4789 Other speech disturbances: Secondary | ICD-10-CM | POA: Insufficient documentation

## 2012-01-25 DIAGNOSIS — I10 Essential (primary) hypertension: Secondary | ICD-10-CM | POA: Insufficient documentation

## 2012-01-25 DIAGNOSIS — R4781 Slurred speech: Secondary | ICD-10-CM | POA: Diagnosis present

## 2012-01-25 DIAGNOSIS — E119 Type 2 diabetes mellitus without complications: Secondary | ICD-10-CM | POA: Insufficient documentation

## 2012-01-25 DIAGNOSIS — Z91199 Patient's noncompliance with other medical treatment and regimen due to unspecified reason: Secondary | ICD-10-CM | POA: Insufficient documentation

## 2012-01-25 HISTORY — DX: Hyperlipidemia, unspecified: E78.5

## 2012-01-25 LAB — CBC WITH DIFFERENTIAL/PLATELET
Basophils Absolute: 0.1 10*3/uL (ref 0.0–0.1)
Basophils Relative: 1 % (ref 0–1)
HCT: 40.5 % (ref 39.0–52.0)
Lymphocytes Relative: 23 % (ref 12–46)
Neutro Abs: 4.3 10*3/uL (ref 1.7–7.7)
Neutrophils Relative %: 66 % (ref 43–77)
Platelets: 250 10*3/uL (ref 150–400)
RDW: 14.8 % (ref 11.5–15.5)
WBC: 6.5 10*3/uL (ref 4.0–10.5)

## 2012-01-25 LAB — URINALYSIS, ROUTINE W REFLEX MICROSCOPIC
Bilirubin Urine: NEGATIVE
Glucose, UA: NEGATIVE mg/dL
Hgb urine dipstick: NEGATIVE
Protein, ur: NEGATIVE mg/dL
Urobilinogen, UA: 0.2 mg/dL (ref 0.0–1.0)

## 2012-01-25 LAB — CBC
HCT: 39.6 % (ref 39.0–52.0)
Hemoglobin: 13 g/dL (ref 13.0–17.0)
MCH: 27.6 pg (ref 26.0–34.0)
MCV: 84.1 fL (ref 78.0–100.0)
RBC: 4.71 MIL/uL (ref 4.22–5.81)

## 2012-01-25 LAB — COMPREHENSIVE METABOLIC PANEL
ALT: 18 U/L (ref 0–53)
AST: 26 U/L (ref 0–37)
Albumin: 3.7 g/dL (ref 3.5–5.2)
CO2: 28 mEq/L (ref 19–32)
Chloride: 100 mEq/L (ref 96–112)
GFR calc non Af Amer: >90 mL/min (ref >90)
Potassium: 4.1 mEq/L (ref 3.5–5.1)
Sodium: 137 mEq/L (ref 135–145)
Total Bilirubin: 0.4 mg/dL (ref 0.3–1.2)

## 2012-01-25 LAB — CREATININE, SERUM: Creatinine, Ser: 0.74 mg/dL (ref 0.50–1.35)

## 2012-01-25 LAB — TROPONIN I
Troponin I: <0.3 ng/mL (ref <0.30)
Troponin I: <0.3 ng/mL (ref <0.30)

## 2012-01-25 LAB — ETHANOL: Alcohol, Ethyl (B): <11 mg/dL (ref 0–11)

## 2012-01-25 LAB — CK TOTAL AND CKMB (NOT AT ARMC)
CK, MB: 6.4 ng/mL (ref 0.3–4.0)
Total CK: 115 U/L (ref 7–232)

## 2012-01-25 LAB — PROTIME-INR: INR: 0.95 (ref 0.00–1.49)

## 2012-01-25 MED ORDER — ASPIRIN EC 81 MG PO TBEC
81.0000 mg | DELAYED_RELEASE_TABLET | Freq: Every day | ORAL | Status: DC
  Filled 2012-01-25 (×2): qty 1

## 2012-01-25 MED ORDER — METOPROLOL TARTRATE 25 MG PO TABS
25.0000 mg | ORAL_TABLET | Freq: Two times a day (BID) | ORAL | Status: DC
  Administered 2012-01-25 – 2012-01-27 (×3): 25 mg via ORAL
  Filled 2012-01-25 (×6): qty 1

## 2012-01-25 MED ORDER — LISINOPRIL 20 MG PO TABS
20.0000 mg | ORAL_TABLET | Freq: Every day | ORAL | Status: DC
  Administered 2012-01-25 – 2012-01-27 (×3): 20 mg via ORAL
  Filled 2012-01-25 (×3): qty 1

## 2012-01-25 MED ORDER — INSULIN ASPART 100 UNIT/ML ~~LOC~~ SOLN
0.0000 [IU] | Freq: Three times a day (TID) | SUBCUTANEOUS | Status: DC
  Administered 2012-01-26: 3 [IU] via SUBCUTANEOUS
  Administered 2012-01-26: 5 [IU] via SUBCUTANEOUS
  Administered 2012-01-26: 2 [IU] via SUBCUTANEOUS
  Administered 2012-01-27 (×2): 3 [IU] via SUBCUTANEOUS

## 2012-01-25 MED ORDER — ONDANSETRON HCL 4 MG/2ML IJ SOLN: 4.0000 mg | Freq: Four times a day (QID) | INTRAMUSCULAR | Status: DC | PRN

## 2012-01-25 MED ORDER — LABETALOL HCL 5 MG/ML IV SOLN
5.0000 mg | Freq: Once | INTRAVENOUS | Status: AC
  Administered 2012-01-25: 5 mg via INTRAVENOUS
  Filled 2012-01-25: qty 4

## 2012-01-25 MED ORDER — CLOPIDOGREL BISULFATE 75 MG PO TABS
75.0000 mg | ORAL_TABLET | Freq: Every day | ORAL | Status: DC
  Administered 2012-01-26 – 2012-01-27 (×2): 75 mg via ORAL
  Filled 2012-01-25 (×3): qty 1

## 2012-01-25 MED ORDER — ENOXAPARIN SODIUM 40 MG/0.4ML ~~LOC~~ SOLN
40.0000 mg | SUBCUTANEOUS | Status: DC
  Filled 2012-01-25 (×2): qty 0.4

## 2012-01-25 MED ORDER — SENNOSIDES-DOCUSATE SODIUM 8.6-50 MG PO TABS: 1.0000 | ORAL_TABLET | Freq: Every evening | ORAL | Status: DC | PRN

## 2012-01-25 MED ORDER — VITAMIN B-1 100 MG PO TABS
100.0000 mg | ORAL_TABLET | Freq: Every day | ORAL | Status: DC
  Administered 2012-01-25 – 2012-01-27 (×3): 100 mg via ORAL
  Filled 2012-01-25 (×3): qty 1

## 2012-01-25 MED ORDER — ASPIRIN EC 81 MG PO TBEC
81.0000 mg | DELAYED_RELEASE_TABLET | Freq: Every day | ORAL | Status: DC
  Administered 2012-01-25 – 2012-01-27 (×3): 81 mg via ORAL
  Filled 2012-01-25 (×3): qty 1

## 2012-01-25 MED ORDER — ENOXAPARIN SODIUM 40 MG/0.4ML ~~LOC~~ SOLN
40.0000 mg | SUBCUTANEOUS | Status: DC
  Administered 2012-01-25 – 2012-01-26 (×2): 40 mg via SUBCUTANEOUS
  Filled 2012-01-25 (×3): qty 0.4

## 2012-01-25 MED ORDER — SODIUM CHLORIDE 0.9 % IV SOLN
INTRAVENOUS | Status: AC
  Administered 2012-01-25: 22:00:00 via INTRAVENOUS

## 2012-01-25 MED ORDER — NICOTINE 21 MG/24HR TD PT24
21.0000 mg | MEDICATED_PATCH | Freq: Every day | TRANSDERMAL | Status: DC
  Administered 2012-01-26 – 2012-01-27 (×3): 21 mg via TRANSDERMAL
  Filled 2012-01-25 (×3): qty 1

## 2012-01-25 MED ORDER — PANTOPRAZOLE SODIUM 40 MG PO TBEC
40.0000 mg | DELAYED_RELEASE_TABLET | Freq: Every day | ORAL | Status: DC
  Administered 2012-01-26 – 2012-01-27 (×2): 40 mg via ORAL
  Filled 2012-01-25 (×2): qty 1

## 2012-01-25 MED ORDER — ATORVASTATIN CALCIUM 80 MG PO TABS
80.0000 mg | ORAL_TABLET | Freq: Every day | ORAL | Status: DC
  Administered 2012-01-26: 80 mg via ORAL
  Filled 2012-01-25 (×2): qty 1

## 2012-01-25 NOTE — ED Notes (Signed)
Admit Doctor at bedside.  

## 2012-01-25 NOTE — ED Provider Notes (Signed)
History     CSN: [id number]  Arrival date & time 01/25/12  1053   First MD Initiated Contact with Patient 01/25/12 1239      No chief complaint on file.   (Consider location/radiation/quality/duration/timing/severity/associated sxs/prior treatment) HPI  71 year old male with history of prior stroke, MI, diabetes, and alcohol abuse presents complaining of strokelike symptoms.  Patient reports for the past 2-3 days he noticed that he is having trouble with speech, trouble with his gait, and his right hand is weaker than usual. Symptom has been waxing waning and it seems to get a bit better.  However, since he has stroke in the past he wants to be evaluated.  Pt has been taking his baby aspirin daily.  Denies fever, headache, vision changes, cp, sob, n/v/d, abd pain or dysuria.  Does admits to drinking alcohol on a regular basis, last drink was last night.  Pt also reports he ran out/ have not taking his regular diabetic medications and blood pressure medication for the past month.  He didn't take it because he was feeling better and does not think he needs it.   Past Medical History  Diagnosis Date  . Diabetes mellitus   . Stroke   . Myocardial infarct   . Alcohol abuse   . Coronary artery disease   . Asbestosis     Past Surgical History  Procedure Date  . Heart stents   . Appendectomy   . Coronary artery bypass graft 05/22/2011    Procedure: CORONARY ARTERY BYPASS GRAFTING (CABG);  Surgeon: Delight Ovens, MD;  Location: [facility] OR;  Service: Open Heart Surgery;  Laterality: N/A;  Times 6 using endoscopically harvested right greater saphenous vein and left internal mammory artery.     No family history on file.  History  Substance Use Topics  . Smoking status: Former Smoker -- 1.0 packs/day    Types: Cigarettes    Quit date: 05/18/2011  . Smokeless tobacco: Never Used  . Alcohol Use: Yes      Review of Systems  All other systems reviewed and are negative.    Allergies    Codeine  Home Medications   Current Outpatient Rx  Name  Route  Sig  Dispense  Refill  . AMIODARONE HCL 400 MG PO TABS   Oral   Take 1 tablet (400 mg total) by mouth every 12 (twelve) hours. For 7 days, then take 400 mg daily   70 tablet   1   . VITAMIN C PO   Oral   Take 1 tablet by mouth daily.         . ASPIRIN 81 MG PO TBEC   Oral   Take 1 tablet (81 mg total) by mouth daily.         . ATORVASTATIN CALCIUM 80 MG PO TABS   Oral   Take 1 tablet (80 mg total) by mouth daily at 6 PM.   30 tablet   1   . CLOPIDOGREL BISULFATE 75 MG PO TABS   Oral   Take 1 tablet (75 mg total) by mouth daily with breakfast.   30 tablet   1   . VITAMIN B 12 PO   Oral   Take 1 tablet by mouth daily.         Marland Kitchen NOVOLOG MIX 70/30 [place]   Subcutaneous   Inject 20 Units into the skin 2 (two) times daily. 20-30 units [place] BID         . INSULIN  GLARGINE 100 UNIT/ML [place] SOLN   Subcutaneous   Inject 32 Units into the skin daily.   10 mL   6   . LISINOPRIL 20 MG PO TABS   Oral   Take 1 tablet (20 mg total) by mouth daily.   30 tablet   11   . METFORMIN HCL 1000 MG PO TABS   Oral   Take 500 mg by mouth 2 (two) times daily with a meal.          . METOPROLOL TARTRATE 25 MG PO TABS   Oral   Take 1 tablet (25 mg total) by mouth 2 (two) times daily.   60 tablet   1   . ZINC PO   Oral   Take 1 tablet by mouth daily.         . OXYCODONE HCL 5 MG PO TABS   Oral   Take 1-2 tablets (5-10 mg total) by mouth every 4 (four) hours as needed for pain.   50 tablet   0   . OXYCODONE HCL 5 MG PO CAPS   Oral   Take 5 mg by mouth every 4 (four) hours as needed.         . OXYCODONE-ACETAMINOPHEN 5-325 MG PO TABS   Oral   Take 1 tablet by mouth every 6 (six) hours as needed for pain.   40 tablet   0   . VITAMIN B-6 PO   Oral   Take 1 tablet by mouth daily.         Marland Kitchen RABEPRAZOLE SODIUM 20 MG PO TBEC   Oral   Take 20 mg by mouth 2 (two) times daily.          . THIAMINE  HCL 100 MG PO TABS   Oral   Take 1 tablet (100 mg total) by mouth daily.   30 tablet   1     BP 170/75  Pulse 60  Temp 97.9 F (36.6 C) (Oral)  Resp 10  SpO2 98%  Physical Exam  Nursing note and vitals reviewed. Constitutional: He is oriented to person, place, and time. He appears well-developed and well-nourished. No distress.       Awake, alert, nontoxic appearance  HENT:  Head: Atraumatic.  Eyes: Conjunctivae normal and EOM are normal. Pupils are equal, round, and reactive to light. Right eye exhibits no discharge. Left eye exhibits no discharge.  Neck: Normal range of motion. Neck supple.  Cardiovascular: Normal rate and regular rhythm.   Murmur (faint 2/6 systolic murmur noted in 3rd-4th intercostal space, left chest, nonradiate) heard. Pulmonary/Chest: Effort normal. No respiratory distress. He exhibits no tenderness.  Abdominal: Soft. There is no tenderness. There is no rebound.  Musculoskeletal: He exhibits no tenderness.       ROM appears intact, no obvious focal weakness  Neurological: He is alert and oriented to person, place, and time. He has normal reflexes. He displays normal reflexes. No cranial nerve deficit. He exhibits normal muscle tone. Coordination normal.        A&O x 3, speech clear, cognition appears to be normal, CN II-XII grossly normal - face symmetric, no deviation tongue, PERRLA EOMI, normal shoulder shrug; MS 5/5 throughout; DTRs 2+ and symmetrical; cerebellar - normal finger-nose, heel-shin, rapid alternating finger movement, no dysdiadochokinesia, nl gait; cerebellar - no tremor, no cogwheeling, normal gait, mild difficulty with tandem gait.    Skin: Skin is warm and dry. No rash noted.  Psychiatric: He has a normal mood  and affect.    ED Course  Procedures (including critical care time)  Results for orders placed during the hospital encounter of 01/25/12  CBC WITH DIFFERENTIAL      Component Value Range   WBC 6.5  4.0 - 10.5 K/uL   RBC 4.81   4.22 - 5.81 MIL/uL   Hemoglobin 13.4  13.0 - 17.0 g/dL   HCT 78.2  95.6 - 21.3 %   MCV 84.2  78.0 - 100.0 fL   MCH 27.9  26.0 - 34.0 pg   MCHC 33.1  30.0 - 36.0 g/dL   RDW 08.6  57.8 - 46.9 %   Platelets 250  150 - 400 K/uL   Neutrophils Relative 66  43 - 77 %   Neutro Abs 4.3  1.7 - 7.7 K/uL   Lymphocytes Relative 23  12 - 46 %   Lymphs Abs 1.5  0.7 - 4.0 K/uL   Monocytes Relative 7  3 - 12 %   Monocytes Absolute 0.4  0.1 - 1.0 K/uL   Eosinophils Relative 3  0 - 5 %   Eosinophils Absolute 0.2  0.0 - 0.7 K/uL   Basophils Relative 1  0 - 1 %   Basophils Absolute 0.1  0.0 - 0.1 K/uL  COMPREHENSIVE METABOLIC PANEL      Component Value Range   Sodium 137  135 - 145 mEq/L   Potassium 4.1  3.5 - 5.1 mEq/L   Chloride 100  96 - 112 mEq/L   CO2 28  19 - 32 mEq/L   Glucose, Bld 130 (*) 70 - 99 mg/dL   BUN 17  6 - 23 mg/dL   Creatinine, Ser 6.29  0.50 - 1.35 mg/dL   Calcium 9.6  8.4 - 52.8 mg/dL   Total Protein 7.2  6.0 - 8.3 g/dL   Albumin 3.7  3.5 - 5.2 g/dL   AST 26  0 - 37 U/L   ALT 18  0 - 53 U/L   Alkaline Phosphatase 147 (*) 39 - 117 U/L   Total Bilirubin 0.4  0.3 - 1.2 mg/dL   GFR calc non Af Amer >90  >90 mL/min   GFR calc Af Amer >90  >90 mL/min  URINALYSIS, ROUTINE W REFLEX MICROSCOPIC      Component Value Range   Color, Urine YELLOW  YELLOW   APPearance CLEAR  CLEAR   Specific Gravity, Urine 1.015  1.005 - 1.030   pH 6.0  5.0 - 8.0   Glucose, UA NEGATIVE  NEGATIVE mg/dL   Hgb urine dipstick NEGATIVE  NEGATIVE   Bilirubin Urine NEGATIVE  NEGATIVE   Ketones, ur NEGATIVE  NEGATIVE mg/dL   Protein, ur NEGATIVE  NEGATIVE mg/dL   Urobilinogen, UA 0.2  0.0 - 1.0 mg/dL   Nitrite NEGATIVE  NEGATIVE   Leukocytes, UA NEGATIVE  NEGATIVE  TROPONIN I      Component Value Range   Troponin I <0.30  <0.30 ng/mL  ETHANOL      Component Value Range   Alcohol, Ethyl (B) <11  0 - 11 mg/dL  PROTIME-INR      Component Value Range   Prothrombin Time 12.6  11.6 - 15.2 seconds   INR  0.95  0.00 - 1.49   Ct Head Wo Contrast  01/25/2012  *RADIOLOGY REPORT*  Clinical Data: Slurred speech.  Right hand weakness.  Stumbling for 3 days.  CT HEAD WITHOUT CONTRAST  Technique:  Contiguous axial images were obtained from the base of  the skull through the vertex without contrast.  Comparison: 04/15/2011  Findings: The ventricles are normal in configuration.  There is ventricular and sulcal enlargement reflecting mild atrophy.  A well- defined focus of hypoattenuation has developed at the base of the left middle cerebral peduncle in the mid brain consistent with a lacunar infarct that is chronic but new from the prior exam.  There are old cerebellar infarcts that are stable. Mild periventricular white matter hypoattenuation is also stable consistent with chronic microvascular ischemic change.  There is no evidence of a recent infarct.  There are no extra-axial masses or abnormal fluid collections.  There is no intracranial hemorrhage.  Mucous retention cyst in the left maxillary sinus.  The remaining visualized sinuses and mastoid air cells are clear.  No skull lesion/fracture.  IMPRESSION:  No acute intracranial abnormalities.  Atrophy, mild chronic microvascular ischemic change and old infarcts as described.   Original Report Authenticated By: Amie Portland, M.D.     1. Stroke like symptoms 2. Hypertension 3. Medication noncompliant   MDM  Pt presents with subjective R sided weakness, slurred speech and difficulty walking.  No obvious neurodeficits on exam.  However, due to his multiple comorbidities and pt sts his sxs has not resolved, plan for admission for further care.  Discussed care with my attending.     Pt also haven't been taking his blood pressure medication and diabetes medication.  His BP has been high here.  It will need to be addressed once admitted.    3:12 PM i have consulted with [facility] resident, who agrees to see patient in ED and will admit as  appropriate.  Pt agrees with plan.    BP 175/74  Pulse 57  Temp 98.5 F (36.9 C) (Oral)  Resp 18  SpO2 99%  I have reviewed nursing notes and vital signs. I personally reviewed the imaging tests through PACS system  I reviewed available ER/hospitalization records thought the EMR    Fayrene Helper, PA-C 01/25/12 1513

## 2012-01-25 NOTE — Progress Notes (Signed)
[facility] results of 6.4 read to Lenny Pastel NP.

## 2012-01-25 NOTE — ED Provider Notes (Signed)
Medical screening examination/treatment/procedure(s) were performed by non-physician practitioner and as supervising physician I was immediately available for consultation/collaboration.  Kenta Laster R. Jisell Majer, MD 01/25/12 1614 

## 2012-01-25 NOTE — H&P (Signed)
[facility] History and Physical  JATERRIUS RICKETSON WUJ:[id number] DOB: [birth year] DOA: 01/25/2012  Referring physician: Dr. Bosie Clos PCP: Elby Showers, MD  Specialists: Neurology. Dr. Amada Jupiter pending  Chief Complaint: Slurred speech/right-sided weakness  HPI: Larry Cooper is a 71 y.o. male with history of multiple strokes, coronary artery disease status post CABG March of 2013, diabetes mellitus, hypertension, ongoing tobacco abuse, hyperlipidemia, who presents to the ED with right upper extremity weakness and slurred speech x3 days. Patient states he was in the garden work and felt weak and diaphoretic and subsequently went inside check his blood glucose levels in the lower 50s. Patient states he ate a piece of candy fell asleep for about an hour. Patient states on awakening he noted his right arm weakness and some tingling in the tips of his fingers with some associated slurred speech. Patient states still has some tingling in his fingers is still with slurred speech. Patient stated that his family had been trying to convince him to come to the emergency room and he finally did today after dropping his son of for detox. Patient denies any fever, no chest pain, no shortness of breath, no nausea, no vomiting, no abdominal pain, no diarrhea, no constipation, no cough, no weakness, patient endorses bilateral lower extremity tingling and numbness in his toes. Patient was seen in the ED head CT which was done was negative. Were called to admit the patient for further evaluation and management. Patient was initially seen by the teaching service team and noted that patient's PCP was covered by the hospitalist team and a such we were called to admit the patient.   Review of Systems: The patient denies anorexia, fever, weight loss,, vision loss, decreased hearing, hoarseness, chest pain, syncope, dyspnea on exertion, peripheral edema, balance deficits, hemoptysis, abdominal pain, melena,  hematochezia, severe indigestion/heartburn, hematuria, incontinence, genital sores, muscle weakness, suspicious skin lesions, transient blindness, difficulty walking, depression, unusual weight change, abnormal bleeding, enlarged lymph nodes, angioedema, and breast masses.    Past Medical History  Diagnosis Date  . Diabetes mellitus   . Stroke   . Myocardial infarct   . Alcohol abuse   . Coronary artery disease   . Asbestosis   . HTN (hypertension) 01/25/2012  . Hyperlipidemia 01/25/2012   Past Surgical History  Procedure Date  . Heart stents   . Appendectomy   . Coronary artery bypass graft 05/22/2011    Procedure: CORONARY ARTERY BYPASS GRAFTING (CABG);  Surgeon: Delight Ovens, MD;  Location: [facility] OR;  Service: Open Heart Surgery;  Laterality: N/A;  Times 6 using endoscopically harvested right greater saphenous vein and left internal mammory artery.    Social History:  reports that he quit smoking about 8 months ago. His smoking use included Cigarettes. He smoked 1 pack per day. He has never used smokeless tobacco. He reports that he drinks alcohol. He reports that he does not use illicit drugs.  Allergies  Allergen Reactions  . Codeine Nausea And Vomiting, Swelling and Other (See Comments)    Blisters and swelling    History reviewed. No pertinent family history.  Prior to Admission medications   Medication Sig Start Date End Date Taking? Authorizing Provider  Ascorbic Acid (VITAMIN C PO) Take 1 tablet by mouth daily.   Yes Historical Provider, MD  aspirin EC 81 MG EC tablet Take 1 tablet (81 mg total) by mouth daily. 05/25/11  Yes Rowe Clack, PA  atorvastatin (LIPITOR) 80 MG tablet Take 1 tablet (80 mg total) by  mouth daily at 6 PM. 05/25/11 05/24/12 Yes Rowe Clack, PA  clopidogrel (PLAVIX) 75 MG tablet Take 1 tablet (75 mg total) by mouth daily with breakfast. 05/25/11 05/24/12 Yes Rowe Clack, PA  Cyanocobalamin (VITAMIN B 12 PO) Take 1 tablet by mouth daily.   Yes Historical  Provider, MD  insulin aspart protamine-insulin aspart (NOVOLOG 70/30) (70-30) 100 UNIT/ML injection Inject 30 Units into the skin 2 (two) times daily with a meal.   Yes Historical Provider, MD  lisinopril (PRINIVIL,ZESTRIL) 20 MG tablet Take 1 tablet (20 mg total) by mouth daily. 06/11/11 06/10/12 Yes Rowe Clack, PA  metFORMIN (GLUCOPHAGE) 1000 MG tablet Take 500 mg by mouth 2 (two) times daily with a meal.    Yes Historical Provider, MD  metoprolol tartrate (LOPRESSOR) 25 MG tablet Take 1 tablet (25 mg total) by mouth 2 (two) times daily. 05/25/11 05/24/12 Yes Rowe Clack, PA  Multiple Vitamin (MULTIVITAMIN WITH MINERALS) TABS Take 1 tablet by mouth daily.   Yes Historical Provider, MD  Pyridoxine HCl (VITAMIN B-6 PO) Take 1 tablet by mouth daily.   Yes Historical Provider, MD  RABEprazole (ACIPHEX) 20 MG tablet Take 20 mg by mouth daily.    Yes Historical Provider, MD  thiamine 100 MG tablet Take 1 tablet (100 mg total) by mouth daily. 05/25/11 05/24/12 Yes Rowe Clack, PA   Physical Exam: Filed Vitals:   01/25/12 1830 01/25/12 1845 01/25/12 1900 01/25/12 1915  BP: 155/79 158/71 157/76 157/50  Pulse: 63 73 72 55  Temp:      TempSrc:      Resp: 20 22 16 18   SpO2: 98% 98% 98% 98%     General:  NAD. Slurred speech  Eyes: Pupils equal round and reactive to light and accommodation. Extraocular movements intact.  ENT: Oropharynx is clear, no lesions, no exudates.  Neck: Supple with no lymphadenopathy. No JVD.  Cardiovascular: Regular rate and rhythm  Respiratory: CTA bilaterally  Abdomen: Soft, nontender, nondistended, positive bowel sounds  Skin: No lesions  Musculoskeletal: 5/ 5 bilateral upper extremity strength. 5 out of 5 bilateral lower extremities  Psychiatric: Normal mood. Normal affect. Fair insight. Fair judgment.  Neurologic: Alert and oriented x3. Cranial nerves II through XII are grossly intact. Patient does have slurred speech. Sensation is intact. Unable to elicit  reflexes symmetrically in diffuse sleep. Visual fields are intact. Gait not tested secondary to safety.  Labs on Admission:  Basic Metabolic Panel:  Lab 01/25/12 9147  NA 137  K 4.1  CL 100  CO2 28  GLUCOSE 130*  BUN 17  CREATININE 0.74  CALCIUM 9.6  MG --  PHOS --   Liver Function Tests:  Lab 01/25/12 1325  AST 26  ALT 18  ALKPHOS 147*  BILITOT 0.4  PROT 7.2  ALBUMIN 3.7   No results found for this basename: LIPASE:5,AMYLASE:5 in the last 168 hours No results found for this basename: AMMONIA:5 in the last 168 hours CBC:  Lab 01/25/12 1325  WBC 6.5  NEUTROABS 4.3  HGB 13.4  HCT 40.5  MCV 84.2  PLT 250   Cardiac Enzymes:  Lab 01/25/12 1325  CKTOTAL --  CKMB --  CKMBINDEX --  TROPONINI <0.30    BNP (last 3 results) No results found for this basename: PROBNP:3 in the last 8760 hours CBG: No results found for this basename: GLUCAP:5 in the last 168 hours  Radiological Exams on Admission: Ct Head Wo Contrast  01/25/2012  *RADIOLOGY REPORT*  Clinical Data: Slurred speech.  Right hand weakness.  Stumbling for 3 days.  CT HEAD WITHOUT CONTRAST  Technique:  Contiguous axial images were obtained from the base of the skull through the vertex without contrast.  Comparison: 04/15/2011  Findings: The ventricles are normal in configuration.  There is ventricular and sulcal enlargement reflecting mild atrophy.  A well- defined focus of hypoattenuation has developed at the base of the left middle cerebral peduncle in the mid brain consistent with a lacunar infarct that is chronic but new from the prior exam.  There are old cerebellar infarcts that are stable. Mild periventricular white matter hypoattenuation is also stable consistent with chronic microvascular ischemic change.  There is no evidence of a recent infarct.  There are no extra-axial masses or abnormal fluid collections.  There is no intracranial hemorrhage.  Mucous retention cyst in the left maxillary sinus.  The  remaining visualized sinuses and mastoid air cells are clear.  No skull lesion/fracture.  IMPRESSION:  No acute intracranial abnormalities.  Atrophy, mild chronic microvascular ischemic change and old infarcts as described.   Original Report Authenticated By: Amie Portland, M.D.     EKG: None  Assessment/Plan Principal Problem:  *Slurred speech Active Problems:  Coronary atherosclerosis of native coronary artery  Type II or unspecified type diabetes mellitus without mention of complication, not stated as uncontrolled  H/O  Tobacco abuse  Weakness  HTN (hypertension)  Hyperlipidemia  #1 slurred speech/right upper extremity weakness Questionable etiology. Patient with multiple risk factors of coronary artery disease, hypertension, hyperlipidemia, ongoing tobacco abuse, prior history of CVAs. Will admit the patient under telemetry to observation. CT of the head was negative. Will check MRI of the head. Check a fasting lipid panel. Check carotid Dopplers. Check a 2-D echo. Continue home regimen of aspirin and Plavix. Will consult with neurology for further evaluation and treatment. Monitor CBGs. Hold oral hypoglycemic agents and long-acting insulin.  #2 hypertension Patient states her out of his antihypertensive medications over the past one to 2 days. We'll resume patient's home regimen of lisinopril, metoprolol.  #3 coronary artery disease status post CABG   3/ 2013 Check a fasting lipid panel. Continue aspirin, Plavix, atorvastatin, metoprolol, lisinopril. Follow.  #4 diabetes mellitus type 2 Patient stated had some hypoglycemic spells prior to admission. Will hold patient's long acting insulin. Will hold patient's oral hypoglycemic agents. Place on sliding scale insulin. Follow.  #5 hyperlipidemia Check a fasting lipid panel. Continue home dose of atorvastatin.  #6 gastroesophageal reflux disease PPI  #7 tobacco abuse Place on nicotine patch. Tobacco cessation.  #8  prophylaxis PPI for GI prophylaxis. Lovenox for DVT prophylaxis. Code Status: Full Family Communication: Updated patient no family at bedside. Disposition Plan: Admit to observation  Time spent: 60 mins   [facility] Pager [phone]  If 7PM-7AM, please contact night-coverage [email or web address] Password [facility] 01/25/2012, 7:45 PM

## 2012-01-25 NOTE — ED Notes (Addendum)
Pt states he noticed weakness and numbness on right arm he could not control his right hand. 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

## 2012-01-25 NOTE — Consult Note (Signed)
Reason for Consult: Slurred speech Referring Physician: Ramiro Harvest  CC: Slurred speech  History is obtained from: Patient  HPI: Larry Cooper is a 71 y.o. male who was in his normal state of health up until 3 days ago at which point he felt lightheaded. He was working in the yard, and came in to light his blood sugar was low so he ate something and lay down to sleep. When he awoke, he had right arm weakness and slurred speech. He also noted some tingling of his right hand at that time. Since that time, he feels for the right-sided weakness has improved, but still has some trouble writing. He continues to have slurred speech, however and therefore presented today.  When discussing MRI with the patient, he states that previously because he had injured his eye with metal, there was concern that he could not get one.   Last seen normal: three days ago tPA given: no, outside of window.   ROS: A 14 point ROS was performed and is negative except as noted in the HPI.  Past Medical History  Diagnosis Date  . Diabetes mellitus   . Stroke   . Myocardial infarct   . Alcohol abuse   . Coronary artery disease   . Asbestosis   . HTN (hypertension) 01/25/2012  . Hyperlipidemia 01/25/2012    Family History: No history of strokes  Social History: Tob: Smokes half-pack a day  Exam: Current vital signs: BP 176/68  Pulse 56  Temp 98 F (36.7 C) (Oral)  Resp 20  Ht 5\' 9"  (1.753 m)  Wt 85.866 kg (189 lb 4.8 oz)  BMI 27.95 kg/m2  SpO2 99% Vital signs in last 24 hours: Temp:  [97.4 F (36.3 C)-98.5 F (36.9 C)] 98 F (36.7 C) (11/08 2040) Pulse Rate:  [52-73] 56  (11/08 2040) Resp:  [10-22] 20  (11/08 2040) BP: (155-193)/(50-85) 176/68 mmHg (11/08 2040) SpO2:  [98 %-100 %] 99 % (11/08 2040) Weight:  [85.866 kg (189 lb 4.8 oz)] 85.866 kg (189 lb 4.8 oz) (11/08 2030)  General: In bed, NAD CV: RRR Mental Status: Patient is awake, alert, oriented to person, place, month, year, and  situation. Immediate and remote memory are intact. Patient is able to give a clear and coherent history. No evidence of aphasia, but has significant dysarthria.  Cranial Nerves: II: Visual Fields are full. Pupils are equal, round, and reactive to light.  Discs are difficult to visualize. III,IV, VI: EOMI without ptosis or diploplia.  V: Facial sensation is symmetric to temperature VII: Facial movement is notable for a very minor lag on right  VIII: hearing is intact to voice X: Uvula elevates symmetrically XI: Shoulder shrug is symmetric. XII: tongue is midline without atrophy or fasciculations.  Motor: Tone is normal. Bulk is normal. 5/5 strength was present in all four extremities. He has mild slowness to fine motor movements in his right hand.  Sensory: Sensation is symmetric to light touch and temperature in the arms and legs. Deep Tendon Reflexes: 2+ and symmetric in the biceps and patellae. Absent at the ankles.  Cerebellar: FNF intact bilaterally Gait: Patient has a stable casual gait, though slightly wide based.   I have reviewed labs in epic and the results pertinent to this consultation are: BMP, unremarkable, CBC wnl  I have reviewed the images obtained:CT head   Impression: 72 yo M with sudden onset dysarthria and right arm weakness 3 days ago. I suspect that he had a small stroke,  likely pontine or internal capsule(clumsy-hand dysarthria syndrome). He is supposed to be on dual antiplatelet therapy for cardiac reasons(per patient) but was not taking plavix. If needed from cardiac perspective, this is fine. From a strictly secondary stroke prevention standpoint, only single antiplatelet is needed and since he has failed asa, I would use aggrenox or plavix monotherapy if the dual antiplatelet was just for stroke prevention.   Recommendations: 1. HgbA1c, fasting lipid panel 2. MRI, MRA  of the brain without contrast 3. PT consult, OT consult, Speech consult 4.  Echocardiogram 5. Carotid dopplers 6. Prophylactic therapy-ASA+plavix 7. Risk factor modification 8. Telemetry monitoring 9. Frequent neuro checks  Ritta Slot, MD [facility] [phone]  If 7pm- 7am, please page neurology on call at [phone].

## 2012-01-25 NOTE — ED Notes (Signed)
States started w/ slurred speech rt hand weak and stumbling  X 3 days wasn t getting better

## 2012-01-25 NOTE — ED Notes (Signed)
Turkey sandwich given to patient

## 2012-01-25 NOTE — H&P (Signed)
Hospital Admission Note Date: 01/25/2012  Patient name: Larry Cooper Medical record number: [id number] Date of birth: Jan 08, 1941 Age: 71 y.o. Gender: male PCP: No primary provider on file.  Medical Service: Internal Medicine Teaching Service  Attending physician:  Dr. Meredith Pel    1st Contact: Dr. Sherrine Maples    Pager: [phone] 2nd Contact: Dr. 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 He states that he was out in his garden an felt weak, went inside and his blood glucose level was in the 50s. He ate a piece of candy and fell asleep for about 1hr. When he awoke, his right arm was weak and he felt like his speech was slurred. He decided to come to the ED today while he was here dropping his son off at the hospital for detox treatment. He states that his sx are improving but have not completely resolved. He denies headache, dizziness, N/V, diarrhea, constipation, fevers, chills. endorses long standing numbness and tingling in his BLE, esp his toes.  Meds: Current Outpatient Rx  Name  Route  Sig  Dispense  Refill  . VITAMIN C PO   Oral   Take 1 tablet by mouth daily.         . ASPIRIN 81 MG PO TBEC   Oral   Take 1 tablet (81 mg total) by mouth daily.         . ATORVASTATIN CALCIUM 80 MG PO TABS   Oral   Take 1 tablet (80 mg total) by mouth daily at 6 PM.   30 tablet   1   . CLOPIDOGREL BISULFATE 75 MG PO TABS   Oral   Take 1 tablet (75 mg total) by mouth daily with breakfast.   30 tablet   1   . VITAMIN B 12 PO   Oral   Take 1 tablet by mouth daily.         . INSULIN  ASPART PROT & ASPART (70-30) 100 UNIT/ML [place] SUSP   Subcutaneous   Inject 30 Units into the skin 2 (two) times daily with a meal.         . LISINOPRIL 20 MG PO TABS   Oral   Take 1 tablet (20 mg total) by mouth daily.   30 tablet   11   . METFORMIN HCL 1000 MG PO TABS   Oral   Take 500 mg by mouth 2 (two) times daily with a meal.          . METOPROLOL TARTRATE 25 MG PO TABS   Oral   Take 1 tablet (25 mg total) by mouth 2 (two) times daily.   60 tablet   1   . ADULT MULTIVITAMIN W/MINERALS CH   Oral   Take 1 tablet by mouth daily.         Marland Kitchen VITAMIN B-6 PO   Oral   Take 1 tablet by mouth daily.         Marland Kitchen RABEPRAZOLE SODIUM 20 MG PO TBEC   Oral   Take  20 mg by mouth daily.          . THIAMINE HCL 100 MG PO TABS   Oral   Take 1 tablet (100 mg total) by mouth daily.   30 tablet   1     Allergies: Allergies as of 01/25/2012 - Review Complete 01/25/2012  Allergen Reaction Noted  . Codeine Nausea And Vomiting, Swelling, and Other (See Comments) 04/21/2011   Past Medical History  Diagnosis Date  . Diabetes mellitus   . Stroke   . Myocardial infarct   . Alcohol abuse   . Coronary artery disease   . Asbestosis    Past Surgical History  Procedure Date  . Heart stents   . Appendectomy   . Coronary artery bypass graft 05/22/2011    Procedure: CORONARY ARTERY BYPASS GRAFTING (CABG);  Surgeon: Delight Ovens, MD;  Location: [facility] OR;  Service: Open Heart Surgery;  Laterality: N/A;  Times 6 using endoscopically harvested right greater saphenous vein and left internal mammory artery.    No family history on file. History   Social History  . Marital Status: Legally Separated    Spouse Name: N/A    Number of Children: N/A  . Years of Education: N/A   Occupational History  . Not on file.   Social History Main Topics  . Smoking status: Former Smoker -- 1.0 packs/day    Types: Cigarettes    Quit date: 05/18/2011  . Smokeless tobacco: Never Used  . Alcohol  Use: Yes  . Drug Use: No  . Sexually Active: Not on file   Other Topics Concern  . Not on file   Social History Narrative  . No narrative on file    Review of Systems: Review of Systems  Constitutional: Denies fever, chills, diaphoresis, appetite change or fatigue.  HEENT: + burry vision and right sided hearing loss.  Respiratory: Denies SOB, DOE, cough, chest tightness, or wheezing.  Cardiovascular: Denies chest pain, palpitations or leg swelling.  Gastrointestinal: Denies nausea, vomiting, abdominal pain, diarrhea, constipation, blood in stool or abdominal distention.  Genitourinary: + urinary frequency. Denies dysuria, urgency, hematuria, flank pain or difficulty urinating.  Musculoskeletal: + B shoulder pain. Denies other pain or edema.  Skin: Denies pallor, rash and wound.  Neurological: + RUE weakness, slurred speech, dizziness, and numbness and tingling in his BLE, esp feet.  Denies seizures, syncope, or headaches.  Psychiatric/Behavioral: Denies suicidal ideation, mood changes, confusion, nervousness, sleep disturbance or agitation   Physical Exam: Blood pressure 178/75, pulse 53, temperature 98.5 F (36.9 C), temperature source Oral, resp. rate 15, SpO2 100.00%. General: Alert & oriented x 3, well-developed, and cooperative on examination.  Head: Normocephalic and atraumatic.  Eyes: PERRL, EOMI, sclera anicteric.  Mouth: Pharynx pink and moist, poor dentition.  Neck: Supple, full ROM. Lungs: CTAB, normal respiratory effort, no accessory muscle use, no crackles, and no wheezes. Heart: Well healed thoracotomy scar. Regular rate, regular rhythm, no murmur, no gallop, and no rub appreciable.  Abdomen: Soft, non-tender, non-distended, normal bowel sounds, no guarding, no rebound tenderness, no organomegaly.  Msk: No joint swelling, warmth, or erythema.  Extremities: 2+ radial and DP pulses bilaterally. BLE mild pitting edema at the shins superior to the ankle and at the  dorsum of the foot.  Neurologic: Alert & oriented X3, cranial nerves II-XII intact, strength 4/5 all extremities, sensation intact to light touch with decreased sensation in the right foot at the toes.  Skin: Abrasion to right forearm. Turgor normal and no  rashes.  Psych: Some difficulty remembering PMH, good eye contact, not anxious appearing, and not depressed appearing.  Lab results: Basic Metabolic Panel:  Basename 01/25/12 1325  NA 137  K 4.1  CL 100  CO2 28  GLUCOSE 130*  BUN 17  CREATININE 0.74  CALCIUM 9.6  MG --  PHOS --   Liver Function Tests:  Basename 01/25/12 1325  AST 26  ALT 18  ALKPHOS 147*  BILITOT 0.4  PROT 7.2  ALBUMIN 3.7   CBC:  Basename 01/25/12 1325  WBC 6.5  NEUTROABS 4.3  HGB 13.4  HCT 40.5  MCV 84.2  PLT 250   Cardiac Enzymes:  Basename 01/25/12 1325  CKTOTAL --  CKMB --  CKMBINDEX --  TROPONINI <0.30   Coagulation:  Basename 01/25/12 1325  LABPROT 12.6  INR 0.95    Alcohol Level:  Basename 01/25/12 1325  ETH <11   Urinalysis:  Basename 01/25/12 1334  COLORURINE YELLOW  LABSPEC 1.015  PHURINE 6.0  GLUCOSEU NEGATIVE  HGBUR NEGATIVE  BILIRUBINUR NEGATIVE  KETONESUR NEGATIVE  PROTEINUR NEGATIVE  UROBILINOGEN 0.2  NITRITE NEGATIVE  LEUKOCYTESUR NEGATIVE    Imaging results:  Ct Head Wo Contrast  01/25/2012  *RADIOLOGY REPORT*  Clinical Data: Slurred speech.  Right hand weakness.  Stumbling for 3 days.  CT HEAD WITHOUT CONTRAST  Technique:  Contiguous axial images were obtained from the base of the skull through the vertex without contrast.  Comparison: 04/15/2011  Findings: The ventricles are normal in configuration.  There is ventricular and sulcal enlargement reflecting mild atrophy.  A well- defined focus of hypoattenuation has developed at the base of the left middle cerebral peduncle in the mid brain consistent with a lacunar infarct that is chronic but new from the prior exam.  There are old cerebellar infarcts  that are stable. Mild periventricular white matter hypoattenuation is also stable consistent with chronic microvascular ischemic change.  There is no evidence of a recent infarct.  There are no extra-axial masses or abnormal fluid collections.  There is no intracranial hemorrhage.  Mucous retention cyst in the left maxillary sinus.  The remaining visualized sinuses and mastoid air cells are clear.  No skull lesion/fracture.  IMPRESSION:  No acute intracranial abnormalities.  Atrophy, mild chronic microvascular ischemic change and old infarcts as described.   Original Report Authenticated By: Amie Portland, M.D.     Other results: EKG: None this ED visit  Assessment & Plan by Problem:  1. Acute onset right sided weakness: Possibly 2/2 hypoglycemia. Given significant CV hx, must r/o cardiac cause and CVA. Troponin x1 negative. EKG pending. CT head showed no acute intracranial abnormalities, but atrophy, mild chronic microvascular ischemic change and old  cerebellar infarcts. With 4/5 strength in all extremities, and speech is slurred. Unsure how much of this is residual from previous strokes. Will check MRI of the head and consider consult Neurology.  - MRI brain - Consider Neurology consult - F/u EKG - Telemetry bed  2. Diabetes mellitus: Pt on Metformin and Novolog at home, will continued Novolog on admission and cover with SSI. Hold Metformin for now. Blood glucose in the ED is 130.  - Novolog 70/30, 30u BID - SSI  3. HTN:  On Lopressor and Lisinopril at home. BP elevated in the ED, but the pt missed his am dose of his medications. Restart home meds. - Lopressor 25mg  BID - Lisinopril 20mg  qday  4. CAD: Reported h/o multiple MIs with CABG and multiple CVAs. On Plavix and  ASA at home, as well as Lipitor. Restart home medications. - Plavix 75mg  daily - ASA 81mg  daily - Lipitor 80mg  daily  5. DVT PPx: Lovenox  6. Dispo: Mr. Waynick is actually a patient of [facility] [place].  Signed: Genelle Gather 01/25/2012, 5:16 PM

## 2012-01-26 DIAGNOSIS — I6350 Cerebral infarction due to unspecified occlusion or stenosis of unspecified cerebral artery: Secondary | ICD-10-CM

## 2012-01-26 DIAGNOSIS — I2510 Atherosclerotic heart disease of native coronary artery without angina pectoris: Secondary | ICD-10-CM

## 2012-01-26 LAB — CBC
HCT: 39.7 % (ref 39.0–52.0)
RBC: 4.75 MIL/uL (ref 4.22–5.81)
RDW: 14.9 % (ref 11.5–15.5)
WBC: 6 10*3/uL (ref 4.0–10.5)

## 2012-01-26 LAB — GLUCOSE, CAPILLARY
Glucose-Capillary: 166 mg/dL — ABNORMAL HIGH (ref 70–99)
Glucose-Capillary: 230 mg/dL — ABNORMAL HIGH (ref 70–99)
Glucose-Capillary: 157 mg/dL — ABNORMAL HIGH (ref 70–99)

## 2012-01-26 LAB — HEMOGLOBIN A1C
Hgb A1c MFr Bld: 7.3 % — ABNORMAL HIGH (ref <5.7)
Hgb A1c MFr Bld: 7.4 % — ABNORMAL HIGH (ref <5.7)
Mean Plasma Glucose: 166 mg/dL — ABNORMAL HIGH (ref <117)

## 2012-01-26 LAB — CK TOTAL AND CKMB (NOT AT ARMC)
CK, MB: 5.5 ng/mL — ABNORMAL HIGH (ref 0.3–4.0)
CK, MB: 4.1 ng/mL — ABNORMAL HIGH (ref 0.3–4.0)
Relative Index: INVALID (ref 0.0–2.5)
Total CK: 72 U/L (ref 7–232)
Total CK: 61 U/L (ref 7–232)

## 2012-01-26 LAB — BASIC METABOLIC PANEL
Chloride: 100 mEq/L (ref 96–112)
Creatinine, Ser: 0.7 mg/dL (ref 0.50–1.35)
GFR calc Af Amer: >90 mL/min (ref >90)
Potassium: 4.1 mEq/L (ref 3.5–5.1)
Sodium: 137 mEq/L (ref 135–145)

## 2012-01-26 LAB — LIPID PANEL
HDL: 60 mg/dL (ref >39)
LDL Cholesterol: 107 mg/dL — ABNORMAL HIGH (ref 0–99)
Triglycerides: 114 mg/dL (ref <150)

## 2012-01-26 LAB — TROPONIN I: Troponin I: <0.3 ng/mL (ref <0.30)

## 2012-01-26 NOTE — Progress Notes (Signed)
Subjective: Patient seen and examined, says he feels better at this time.   Objective: Vital signs in last 24 hours: Temp:  [97.7 F (36.5 C)-98.8 F (37.1 C)] 98.8 F (37.1 C) (11/09 1000) Pulse Rate:  [48-73] 59  (11/09 1000) Resp:  [12-22] 18  (11/09 1000) BP: (155-197)/(50-85) 165/66 mmHg (11/09 1000) SpO2:  [98 %-100 %] 100 % (11/09 1000) Weight:  [85.866 kg (189 lb 4.8 oz)] 85.866 kg (189 lb 4.8 oz) (11/08 2030) Weight change:  Last BM Date: 01/25/12  Consults: neurology  Procedures: None  Intake/Output from previous day: 11/08 0701 - 11/09 0700 In: -  Out: 300 [Urine:300] Total I/O In: 360 [P.O.:360] Out: 700 [Urine:700]   Physical Exam: Head: Normocephalic, atraumatic.  Eyes: No signs of jaundice, EOMI Nose: Mucous membranes dry.  Neck: supple,No deformities, masses, or tenderness noted. Lungs: Normal respiratory effort. B/L Clear to auscultation, no crackles or wheezes.  Heart: Regular RR. S1 and S2 normal  Abdomen: BS normoactive. Soft, Nondistended, non-tender.  Extremities: No pretibial edema, no erythema   Lab Results: Basic Metabolic Panel:  Basename 01/26/12 0620 01/25/12 2029 01/25/12 1325  NA 137 -- 137  K 4.1 -- 4.1  CL 100 -- 100  CO2 27 -- 28  GLUCOSE 211* -- 130*  BUN 13 -- 17  CREATININE 0.70 0.74 --  CALCIUM 9.3 -- 9.6  MG -- -- --  PHOS -- -- --   Liver Function Tests:  Basename 01/25/12 1325  AST 26  ALT 18  ALKPHOS 147*  BILITOT 0.4  PROT 7.2  ALBUMIN 3.7   No results found for this basename: LIPASE:2,AMYLASE:2 in the last 72 hours No results found for this basename: AMMONIA:2 in the last 72 hours CBC:  Basename 01/26/12 0620 01/25/12 2029 01/25/12 1325  WBC 6.0 7.3 --  NEUTROABS -- -- 4.3  HGB 13.3 13.0 --  HCT 39.7 39.6 --  MCV 83.6 84.1 --  PLT 211 235 --   Cardiac Enzymes:  Basename 01/26/12 0620 01/26/12 0036 01/25/12 1903  CKTOTAL 72 87 115  CKMB 4.8* 5.5* 6.4*  CKMBINDEX -- -- --  TROPONINI <0.30  <0.30 <0.30   BNP: No results found for this basename: PROBNP:3 in the last 72 hours D-Dimer: No results found for this basename: DDIMER:2 in the last 72 hours CBG:  Basename 01/26/12 1144 01/26/12 0700 01/25/12 2328  GLUCAP 230* 166* 172*   Hemoglobin A1C:  Basename 01/25/12 2029  HGBA1C 7.3*   Fasting Lipid Panel:  Basename 01/26/12 0620  CHOL 190  HDL 60  LDLCALC 107*  TRIG 114  CHOLHDL 3.2  LDLDIRECT --   Thyroid Function Tests: No results found for this basename: TSH,T4TOTAL,FREET4,T3FREE,THYROIDAB in the last 72 hours Anemia Panel: No results found for this basename: VITAMINB12,FOLATE,FERRITIN,TIBC,IRON,RETICCTPCT in the last 72 hours Coagulation:  Basename 01/25/12 1325  LABPROT 12.6  INR 0.95   Urine Drug Screen: Drugs of Abuse  No results found for this basename: labopia, cocainscrnur, labbenz, amphetmu, thcu, labbarb    Alcohol Level:  Basename 01/25/12 1325  ETH <11   Urinalysis:  Basename 01/25/12 1334  COLORURINE YELLOW  LABSPEC 1.015  PHURINE 6.0  GLUCOSEU NEGATIVE  HGBUR NEGATIVE  BILIRUBINUR NEGATIVE  KETONESUR NEGATIVE  PROTEINUR NEGATIVE  UROBILINOGEN 0.2  NITRITE NEGATIVE  LEUKOCYTESUR NEGATIVE   Misc. Labs:  No results found for this or any previous visit (from the past 240 hour(s)).  Studies/Results: Dg Eye Foreign Body  01/25/2012  *RADIOLOGY REPORT*  Clinical Data: Possible history of  eye injury with metal.  Pre MRI.  ORBITS FOR FOREIGN BODY - 2 VIEW  Comparison: CT 01/25/2012  Findings: There is no evidence for intraorbital foreign body. There is opacification of the left maxillary sinus. The patient has dental work.  IMPRESSION: No evidence for retained intraorbital metallic foreign body.   Original Report Authenticated By: Norva Pavlov, M.D.    Dg Chest 2 View  01/25/2012  *RADIOLOGY REPORT*  Clinical Data: Recent stroke  CHEST - 2 VIEW  Comparison: 09/20/2011; 06/11/2011; 05/24/2011; chest CT - 05/21/2011  Findings:   Grossly unchanged cardiac silhouette and mediastinal contours with atherosclerotic calcifications within the thoracic aorta.  Post median sternotomy and CABG. Bilateral pleural calcifications are grossly unchanged.  No new focal airspace opacities.  There is chronic mild elevation of the right hemidiaphragm.  No definite pleural effusion or pneumothorax.  Unchanged bones.  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   Original Report Authenticated By: Tacey Ruiz, MD    Ct Head Wo Contrast  01/25/2012  *RADIOLOGY REPORT*  Clinical Data: Slurred speech.  Right hand weakness.  Stumbling for 3 days.  CT HEAD WITHOUT CONTRAST  Technique:  Contiguous axial images were obtained from the base of the skull through the vertex without contrast.  Comparison: 04/15/2011  Findings: The ventricles are normal in configuration.  There is ventricular and sulcal enlargement reflecting mild atrophy.  A well- defined focus of hypoattenuation has developed at the base of the left middle cerebral peduncle in the mid brain consistent with a lacunar infarct that is chronic but new from the prior exam.  There are old cerebellar infarcts that are stable. Mild periventricular white matter hypoattenuation is also stable consistent with chronic microvascular ischemic change.  There is no evidence of a recent infarct.  There are no extra-axial masses or abnormal fluid collections.  There is no intracranial hemorrhage.  Mucous retention cyst in the left maxillary sinus.  The remaining visualized sinuses and mastoid air cells are clear.  No skull lesion/fracture.  IMPRESSION:  No acute intracranial abnormalities.  Atrophy, mild chronic microvascular ischemic change and old infarcts as described.   Original Report Authenticated By: Amie Portland, M.D.    Mr Brain Wo Contrast  01/26/2012  *RADIOLOGY REPORT*  Clinical Data:  Stroke.  Diabetes and hypertension.  MRI HEAD WITHOUT  CONTRAST MRA HEAD WITHOUT CONTRAST  Technique:  Multiplanar, multiecho pulse sequences of the brain and surrounding structures were obtained without intravenous contrast. Angiographic images of the head were obtained using MRA technique without contrast.  Comparison:  Head CT same day  MRI HEAD  Findings:  There is a 1 cm acute infarction affecting the left pons extending towards the mid brain.  No other acute infarction.  There are old small vessel infarctions within the pons.  There are numerous old cerebellar infarctions, most extensive within the left cerebellum.  The cerebral hemispheres show an old cortical and subcortical infarction in the left occipital lobe and moderate chronic small vessel disease throughout the hemispheric white matter.  No evidence of mass lesion, hemorrhage, hydrocephalus or extra-axial collection.  No pituitary mass.  The left maxillary sinus is full of fluid.  IMPRESSION: 1 cm acute infarction in the left pons extending towards the mid brain.  Numerous other old posterior circulation infarctions most notable on the left cerebellum and left occipital region.  Chronic small vessel disease of the hemispheric deep white matter.  MRA HEAD  Findings: Both internal carotid arteries are  patent into the brain. There is atherosclerotic irregularity in the siphon regions but no flow-limiting stenosis.  The anterior and middle cerebral vessels are patent without proximal stenosis, aneurysm or vascular malformation.  More distal branch vessels do show atherosclerotic irregularity.  Both vertebral arteries are patent to the basilar.  There is mild atherosclerotic irregularity of the distal vertebral arteries but no flow-limiting stenosis.  No basilar stenosis.  Both posterior inferior cerebellar arteries are patent.  There is flow within both posterior cerebral arteries.  The right receives its supply from the anterior circulation.  There is distal vessel irregularity bilaterally.  Both superior  cerebellar arteries are patent but show atherosclerotic irregularity.  IMPRESSION:  No major vessel occlusion or correctable proximal stenosis. Diffuse medium to small vessel atherosclerotic irregularity, most notable in the posterior circulation branches.   Original Report Authenticated By: Paulina Fusi, M.D.    Mr Mra Head/brain Wo Cm  01/26/2012  *RADIOLOGY REPORT*  Clinical Data:  Stroke.  Diabetes and hypertension.  MRI HEAD WITHOUT CONTRAST MRA HEAD WITHOUT CONTRAST  Technique:  Multiplanar, multiecho pulse sequences of the brain and surrounding structures were obtained without intravenous contrast. Angiographic images of the head were obtained using MRA technique without contrast.  Comparison:  Head CT same day  MRI HEAD  Findings:  There is a 1 cm acute infarction affecting the left pons extending towards the mid brain.  No other acute infarction.  There are old small vessel infarctions within the pons.  There are numerous old cerebellar infarctions, most extensive within the left cerebellum.  The cerebral hemispheres show an old cortical and subcortical infarction in the left occipital lobe and moderate chronic small vessel disease throughout the hemispheric white matter.  No evidence of mass lesion, hemorrhage, hydrocephalus or extra-axial collection.  No pituitary mass.  The left maxillary sinus is full of fluid.  IMPRESSION: 1 cm acute infarction in the left pons extending towards the mid brain.  Numerous other old posterior circulation infarctions most notable on the left cerebellum and left occipital region.  Chronic small vessel disease of the hemispheric deep white matter.  MRA HEAD  Findings: Both internal carotid arteries are patent into the brain. There is atherosclerotic irregularity in the siphon regions but no flow-limiting stenosis.  The anterior and middle cerebral vessels are patent without proximal stenosis, aneurysm or vascular malformation.  More distal branch vessels do show  atherosclerotic irregularity.  Both vertebral arteries are patent to the basilar.  There is mild atherosclerotic irregularity of the distal vertebral arteries but no flow-limiting stenosis.  No basilar stenosis.  Both posterior inferior cerebellar arteries are patent.  There is flow within both posterior cerebral arteries.  The right receives its supply from the anterior circulation.  There is distal vessel irregularity bilaterally.  Both superior cerebellar arteries are patent but show atherosclerotic irregularity.  IMPRESSION:  No major vessel occlusion or correctable proximal stenosis. Diffuse medium to small vessel atherosclerotic irregularity, most notable in the posterior circulation branches.   Original Report Authenticated By: Paulina Fusi, M.D.     Medications: Scheduled Meds:   . aspirin EC  81 mg Oral Daily  . atorvastatin  80 mg Oral q1800  . clopidogrel  75 mg Oral Q breakfast  . enoxaparin (LOVENOX) injection  40 mg Subcutaneous Q24H  . insulin aspart  0-9 Units Subcutaneous TID WC  . [COMPLETED] labetalol  5 mg Intravenous Once  . lisinopril  20 mg Oral Daily  . metoprolol tartrate  25 mg Oral BID  .  nicotine  21 mg Transdermal Daily  . pantoprazole  40 mg Oral Q0600  . thiamine  100 mg Oral Daily  . [DISCONTINUED] aspirin EC  81 mg Oral Daily  . [DISCONTINUED] enoxaparin  40 mg Subcutaneous Q24H   Continuous Infusions:   . sodium chloride 75 mL/hr at 01/25/12 2155   PRN Meds:.ondansetron (ZOFRAN) IV, senna-docusate    Principal Problem:  *Slurred speech Active Problems:  Coronary atherosclerosis of native coronary artery  Type II or unspecified type diabetes mellitus without mention of complication, not stated as uncontrolled  H/O  Tobacco abuse  Weakness  HTN (hypertension)  Hyperlipidemia  CVA (cerebral infarction)  Left pontine infarct Patient is currently on aspirin. Echo, dopplers pending PT/OT to evaluate Neurology is following  Hypertension BP is  stable Continue Lisinopril, metoprolol  Diabetes mellitus Sliding scale insulin  coronary artery disease status post CABG 3/ 2013  Continue aspirin, Plavix, atorvastatin, metoprolol, lisinopril. Follow.   Hyperlipidemia Continue Atorvastatin  DVT prophylaxis Lovenox    Eevie Lapp S [facility] Pager: [phone] 01/26/2012, 12:36 PM

## 2012-01-26 NOTE — Progress Notes (Signed)
Occupational Therapy Evaluation Patient Details Name: Larry Cooper MRN: [id number] DOB: Feb 15, 1941 Today's Date: 01/26/2012 Time: 7829-5621 OT Time Calculation (min): 23 min  OT Assessment / Plan / Recommendation Clinical Impression  71 yo s/p L pontine infarct with residual slurred speech and deficits with balance and coordination. Pt will benefit from skilled OT serivces to max independence with safety and ADL to facilitate D/C home with 24/7 S. Pt will benefit from outpt OT. Pt would like to go to [facility] for [employer].    OT Assessment  Patient needs continued OT Services    Follow Up Recommendations  Outpatient OT    Barriers to Discharge None    Equipment Recommendations  Tub/shower seat    Recommendations for Other Services    Frequency  Min 3X/week    Precautions / Restrictions Precautions Precautions: Fall Restrictions Weight Bearing Restrictions: No   Pertinent Vitals/Pain No c/o pain. BP 185/67.    ADL  Eating/Feeding: Independent Where Assessed - Eating/Feeding: Edge of bed Grooming: Set up;Supervision/safety Where Assessed - Grooming: Unsupported sitting Upper Body Bathing: Set up;Supervision/safety Where Assessed - Upper Body Bathing: Unsupported sitting Lower Body Bathing: Supervision/safety;Set up Where Assessed - Lower Body Bathing: Supported sit to stand Upper Body Dressing: Set up;Supervision/safety Where Assessed - Upper Body Dressing: Unsupported sitting Lower Body Dressing: Supervision/safety;Set up Where Assessed - Lower Body Dressing: Supported sit to stand Toilet Transfer: Hydrographic surveyor Method: Sit to Barista: Comfort height toilet Toileting - Architect and Hygiene: Supervision/safety Where Assessed - Engineer, mining and Hygiene: Standing Equipment Used: Gait belt Transfers/Ambulation Related to ADLs: minguard ADL Comments: decreased coordination noted with more  challenging activities    OT Diagnosis: Generalized weakness;Disturbance of vision  OT Problem List: Impaired vision/perception;Decreased coordination;Impaired UE functional use;Decreased strength OT Treatment Interventions: Self-care/ADL training;Neuromuscular education;DME and/or AE instruction;Therapeutic activities;Visual/perceptual remediation/compensation;Patient/family education;Balance training   OT Goals Acute Rehab OT Goals OT Goal Formulation: With patient Time For Goal Achievement: 02/09/12 Potential to Achieve Goals: Good ADL Goals Pt Will Perform Grooming: Standing at sink;with supervision ADL Goal: Grooming - Progress: Goal set today Pt Will Perform Upper Body Dressing: with supervision;Sit to stand from chair ADL Goal: Upper Body Dressing - Progress: Goal set today Pt Will Transfer to Toilet: with supervision;Stand pivot transfer;with DME ADL Goal: Toilet Transfer - Progress: Goal set today Additional ADL Goal #1: Pt will complete funcitonal mobility tasks within room with S during ADL. Arm Goals Additional Arm Goal #1: Pt will complete fine motor coordination HEP with min vc to increase functional use RUE. Arm Goal: Additional Goal #1 - Progress: Goal set today  Visit Information  Last OT Received On: 01/26/12 Assistance Needed: +1    Subjective Data      Prior Functioning     Home Living Lives With: Daughter Available Help at Discharge: Family;Available 24 hours/day Type of Home: Mobile home Home Access: Stairs to enter Entrance Stairs-Number of Steps: 3 Entrance Stairs-Rails: Can reach both Home Layout: One level Bathroom Shower/Tub: Forensic scientist: Standard Bathroom Accessibility: Yes How Accessible: Accessible via walker Home Adaptive Equipment: None Prior Function Level of Independence: Independent Able to Take Stairs?: Yes Driving: Yes Vocation: Retired Musician: Expressive difficulties Dominant  Hand: Right         Vision/Perception Vision - Assessment Eye Alignment: Within Functional Limits Vision Assessment: Vision tested Ocular Range of Motion: Within Functional Limits Alignment/Gaze Preference: Within Defined Limits Tracking/Visual Pursuits: Able to track stimulus in all quads  without difficulty Saccades: Within functional limits Convergence: Impaired - to be further tested in functional context Visual Fields: No apparent deficits Additional Comments: will further assess. Pt c/o blurred vision   Cognition  Overall Cognitive Status: Appears within functional limits for tasks assessed/performed Arousal/Alertness: Awake/alert Orientation Level: Appears intact for tasks assessed Behavior During Session: [facility] for tasks performed    Extremity/Trunk Assessment Right Upper Extremity Assessment RUE ROM/Strength/Tone: Deficits RUE ROM/Strength/Tone Deficits: shoulder limitation PTA RUE Sensation:  (will further assess) RUE Coordination: Deficits RUE Coordination Deficits: decreased coordination. "clumsy hand" Left Upper Extremity Assessment LUE ROM/Strength/Tone: WFL for tasks assessed LUE Sensation: WFL - Light Touch;WFL - Proprioception LUE Coordination: WFL - gross/fine motor Right Lower Extremity Assessment RLE ROM/Strength/Tone: Within functional levels RLE Sensation: WFL - Light Touch Left Lower Extremity Assessment LLE ROM/Strength/Tone: Within functional levels LLE Sensation: WFL - Light Touch     Mobility Bed Mobility Bed Mobility: Supine to Sit;Sit to Supine Supine to Sit: 7: Independent Sitting - Scoot to Edge of Bed: 7: Independent Sit to Supine: 5: Supervision Transfers Transfers: Sit to Stand;Stand to Sit Sit to Stand: 4: Min guard;With upper extremity assist;From bed Stand to Sit: 4: Min guard;To bed;With upper extremity assist Details for Transfer Assistance: LOB posteriorly on initial stand     Shoulder Instructions     Exercise     Balance  Balance Balance Assessed: Yes Standardized Balance Assessment Standardized Balance Assessment: Dynamic Gait Index Dynamic Gait Index Level Surface: Mild Impairment Change in Gait Speed: Mild Impairment Gait with Horizontal Head Turns: Mild Impairment Gait with Vertical Head Turns: Mild Impairment Gait and Pivot Turn: Mild Impairment Step Over Obstacle: Mild Impairment Step Around Obstacles: Normal Steps: Mild Impairment Total Score: 17    End of Session OT - End of Session Equipment Utilized During Treatment: Gait belt Activity Tolerance: Patient tolerated treatment well Patient left: in bed;with call bell/phone within reach;with bed alarm set Nurse Communication: Mobility status  GO Functional Assessment Tool Used: clinical judgement Functional Limitation: Self care Self Care Current Status (Z6109): At least 1 percent but less than 20 percent impaired, limited or restricted Self Care Goal Status (U0454): At least 1 percent but less than 20 percent impaired, limited or restricted   [place] Day 01/26/2012, 5:43 PM [facility], OTR/L  [phone] 01/26/2012

## 2012-01-26 NOTE — H&P (Signed)
Internal Medicine Attending Admission Note Date: 01/26/2012  Patient name: Larry Cooper Medical record number: [id number] Date of birth: 08-Jun-1940 Age: 71 y.o. Gender: male  I saw and evaluated the patient. I reviewed the resident's note and I agree with the resident's findings and plan as documented in the resident's note, with the following additional comments.  Chief Complaint(s): Right arm weakness, slurred speech.  History - key components related to admission: Patient is a 71 year old man with a history of prior strokes, coronary artery disease status post CABG, diabetes mellitus, hypertension, and other problems as outlined in medical history admitted with complaint of right arm weakness and slurred speech which began 3 days prior to admission.   Physical Exam - key components related to admission:  Filed Vitals:   01/25/12 2310 01/26/12 0145 01/26/12 0405 01/26/12 0553  BP: 197/72 170/76 163/70 171/79  Pulse: 51 69 48 49  Temp: 97.7 F (36.5 C) 97.9 F (36.6 C) 97.8 F (36.6 C) 97.8 F (36.6 C)  TempSrc: Oral Oral Oral Oral  Resp: 20 20 18 18   Height:      Weight:      SpO2: 100% 100% 100% 99%   General: Alert, no distress; moderate dysarthria Lungs: Clear heart: Regular; S1-S2, no S3, no S4, no murmurs Abdomen: Bowel sounds present, soft, nontender Extremities: No edema Neurologic: Moderate dysarthria; strength appears normal on right   Lab results:   Basic Metabolic Panel:  Basename 01/26/12 0620 01/25/12 2029 01/25/12 1325  NA 137 -- 137  K 4.1 -- 4.1  CL 100 -- 100  CO2 27 -- 28  GLUCOSE 211* -- 130*  BUN 13 -- 17  CREATININE 0.70 0.74 --  CALCIUM 9.3 -- 9.6  MG -- -- --  PHOS -- -- --   Liver Function Tests:  Basename 01/25/12 1325  AST 26  ALT 18  ALKPHOS 147*  BILITOT 0.4  PROT 7.2  ALBUMIN 3.7    CBC:  Basename 01/26/12 0620 01/25/12 2029 01/25/12 1325  WBC 6.0 7.3 --  NEUTROABS -- -- 4.3  HGB 13.3 13.0 --  HCT 39.7 39.6 --    MCV 83.6 84.1 --  PLT 211 235 --   Cardiac Enzymes:  Basename 01/26/12 0620 01/26/12 0036 01/25/12 1903  CKTOTAL 72 87 115  CKMB 4.8* 5.5* 6.4*  CKMBINDEX -- -- --  TROPONINI <0.30 <0.30 <0.30    CBG:  Basename 01/26/12 0700 01/25/12 2328  GLUCAP 166* 172*   Hemoglobin A1C:  Basename 01/25/12 2029  HGBA1C 7.3*   Fasting Lipid Panel:  Basename 01/26/12 0620  CHOL 190  HDL 60  LDLCALC 107*  TRIG 114  CHOLHDL 3.2  LDLDIRECT --    Coagulation:  Basename 01/25/12 1325  INR 0.95   Alcohol Level:  Basename 01/25/12 1325  ETH <11     Imaging results:  Dg Eye Foreign Body  01/25/2012  *RADIOLOGY REPORT*  Clinical Data: Possible history of eye injury with metal.  Pre MRI.  ORBITS FOR FOREIGN BODY - 2 VIEW  Comparison: CT 01/25/2012  Findings: There is no evidence for intraorbital foreign body. There is opacification of the left maxillary sinus. The patient has dental work.  IMPRESSION: No evidence for retained intraorbital metallic foreign body.   Original Report Authenticated By: Norva Pavlov, M.D.    Dg Chest 2 View  01/25/2012  *RADIOLOGY REPORT*  Clinical Data: Recent stroke  CHEST - 2 VIEW  Comparison: 09/20/2011; 06/11/2011; 05/24/2011; chest CT - 05/21/2011  Findings:  Grossly  unchanged cardiac silhouette and mediastinal contours with atherosclerotic calcifications within the thoracic aorta.  Post median sternotomy and CABG. Bilateral pleural calcifications are grossly unchanged.  No new focal airspace opacities.  There is chronic mild elevation of the right hemidiaphragm.  No definite pleural effusion or pneumothorax.  Unchanged bones.  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   Original Report Authenticated By: Tacey Ruiz, MD    Ct Head Wo Contrast  01/25/2012  *RADIOLOGY REPORT*  Clinical Data: Slurred speech.  Right hand weakness.  Stumbling for 3 days.  CT HEAD WITHOUT CONTRAST  Technique:   Contiguous axial images were obtained from the base of the skull through the vertex without contrast.  Comparison: 04/15/2011  Findings: The ventricles are normal in configuration.  There is ventricular and sulcal enlargement reflecting mild atrophy.  A well- defined focus of hypoattenuation has developed at the base of the left middle cerebral peduncle in the mid brain consistent with a lacunar infarct that is chronic but new from the prior exam.  There are old cerebellar infarcts that are stable. Mild periventricular white matter hypoattenuation is also stable consistent with chronic microvascular ischemic change.  There is no evidence of a recent infarct.  There are no extra-axial masses or abnormal fluid collections.  There is no intracranial hemorrhage.  Mucous retention cyst in the left maxillary sinus.  The remaining visualized sinuses and mastoid air cells are clear.  No skull lesion/fracture.  IMPRESSION:  No acute intracranial abnormalities.  Atrophy, mild chronic microvascular ischemic change and old infarcts as described.   Original Report Authenticated By: Amie Portland, M.D.    Mr Brain Wo Contrast  01/26/2012  *RADIOLOGY REPORT*  Clinical Data:  Stroke.  Diabetes and hypertension.  MRI HEAD WITHOUT CONTRAST MRA HEAD WITHOUT CONTRAST  Technique:  Multiplanar, multiecho pulse sequences of the brain and surrounding structures were obtained without intravenous contrast. Angiographic images of the head were obtained using MRA technique without contrast.  Comparison:  Head CT same day  MRI HEAD  Findings:  There is a 1 cm acute infarction affecting the left pons extending towards the mid brain.  No other acute infarction.  There are old small vessel infarctions within the pons.  There are numerous old cerebellar infarctions, most extensive within the left cerebellum.  The cerebral hemispheres show an old cortical and subcortical infarction in the left occipital lobe and moderate chronic small vessel  disease throughout the hemispheric white matter.  No evidence of mass lesion, hemorrhage, hydrocephalus or extra-axial collection.  No pituitary mass.  The left maxillary sinus is full of fluid.  IMPRESSION: 1 cm acute infarction in the left pons extending towards the mid brain.  Numerous other old posterior circulation infarctions most notable on the left cerebellum and left occipital region.  Chronic small vessel disease of the hemispheric deep white matter.  MRA HEAD  Findings: Both internal carotid arteries are patent into the brain. There is atherosclerotic irregularity in the siphon regions but no flow-limiting stenosis.  The anterior and middle cerebral vessels are patent without proximal stenosis, aneurysm or vascular malformation.  More distal branch vessels do show atherosclerotic irregularity.  Both vertebral arteries are patent to the basilar.  There is mild atherosclerotic irregularity of the distal vertebral arteries but no flow-limiting stenosis.  No basilar stenosis.  Both posterior inferior cerebellar arteries are patent.  There is flow within both posterior cerebral arteries.  The right receives its supply from the anterior circulation.  There is distal vessel irregularity bilaterally.  Both superior cerebellar arteries are patent but show atherosclerotic irregularity.  IMPRESSION:  No major vessel occlusion or correctable proximal stenosis. Diffuse medium to small vessel atherosclerotic irregularity, most notable in the posterior circulation branches.   Original Report Authenticated By: Paulina Fusi, M.D.    Mr Mra Head/brain Wo Cm  01/26/2012  *RADIOLOGY REPORT*  Clinical Data:  Stroke.  Diabetes and hypertension.  MRI HEAD WITHOUT CONTRAST MRA HEAD WITHOUT CONTRAST  Technique:  Multiplanar, multiecho pulse sequences of the brain and surrounding structures were obtained without intravenous contrast. Angiographic images of the head were obtained using MRA technique without contrast.  Comparison:   Head CT same day  MRI HEAD  Findings:  There is a 1 cm acute infarction affecting the left pons extending towards the mid brain.  No other acute infarction.  There are old small vessel infarctions within the pons.  There are numerous old cerebellar infarctions, most extensive within the left cerebellum.  The cerebral hemispheres show an old cortical and subcortical infarction in the left occipital lobe and moderate chronic small vessel disease throughout the hemispheric white matter.  No evidence of mass lesion, hemorrhage, hydrocephalus or extra-axial collection.  No pituitary mass.  The left maxillary sinus is full of fluid.  IMPRESSION: 1 cm acute infarction in the left pons extending towards the mid brain.  Numerous other old posterior circulation infarctions most notable on the left cerebellum and left occipital region.  Chronic small vessel disease of the hemispheric deep white matter.  MRA HEAD  Findings: Both internal carotid arteries are patent into the brain. There is atherosclerotic irregularity in the siphon regions but no flow-limiting stenosis.  The anterior and middle cerebral vessels are patent without proximal stenosis, aneurysm or vascular malformation.  More distal branch vessels do show atherosclerotic irregularity.  Both vertebral arteries are patent to the basilar.  There is mild atherosclerotic irregularity of the distal vertebral arteries but no flow-limiting stenosis.  No basilar stenosis.  Both posterior inferior cerebellar arteries are patent.  There is flow within both posterior cerebral arteries.  The right receives its supply from the anterior circulation.  There is distal vessel irregularity bilaterally.  Both superior cerebellar arteries are patent but show atherosclerotic irregularity.  IMPRESSION:  No major vessel occlusion or correctable proximal stenosis. Diffuse medium to small vessel atherosclerotic irregularity, most notable in the posterior circulation branches.   Original  Report Authenticated By: Paulina Fusi, M.D.      Assessment & Plan by Problem:  1.  CVA.  Patient presented with symptoms of right arm weakness and slurred speech which began about 3 days prior to admission.  MRI of the brain demonstrated a 1 cm acute infarction in the left pons extending towards the mid-brain.  A 2-D echocardiogram and carotid Dopplers are pending.  Although his home medication regimen included aspirin and clopidogrel, patient reports that he has been out of clopidogrel because he could not afford it.  He says that he has been taking aspirin.  Clopidogrel and aspirin have been resumed as inpatient.  A neurology consult was obtained.  OT consult, PT consult, and speech therapy consult have been requested.  2.  Other problems as per resident physician.  3.  Transfer of care.  Patient was initially admitted to our service, but since he is a patient of Dr. Clent Ridges at [facility], his care was transferred to the hospitalist service and they are now managing his care along with the neurology  Research scientist (medical).

## 2012-01-26 NOTE — Progress Notes (Signed)
Stroke Team Progress Note  HISTORY Larry Cooper is a 71 y.o. male who was in his normal state of health up until 3 days ago at which point he felt lightheaded. He was working in the yard, and came in to light his blood sugar was low so he ate something and lay down to sleep. When he awoke, he had right arm weakness and slurred speech. He also noted some tingling of his right hand at that time. Since that time, he feels for the right-sided weakness has improved, but still has some trouble writing. He continued to have slurred speech, however and therefore presented on 01/25/12.  Patient was not a TPA candidate secondary to  Delay in arrival He was admitted to the neuro unit for further evaluation and treatment.  SUBJECTIVE SLP is at the bedside.  Overall he feels his condition is  Improving. He has h/o multiple strokes and TIAs x 10 years. He states he stopped all his medicines 1 month ago as he ran out and could afford them. OBJECTIVE Most recent Vital Signs: Filed Vitals:   01/26/12 0145 01/26/12 0405 01/26/12 0553 01/26/12 1000  BP: 170/76 163/70 171/79 165/66  Pulse: 69 48 49 59  Temp: 97.9 F (36.6 C) 97.8 F (36.6 C) 97.8 F (36.6 C) 98.8 F (37.1 C)  TempSrc: Oral Oral Oral Oral  Resp: 20 18 18 18   Height:      Weight:      SpO2: 100% 100% 99% 100%   CBG (last 3)   Basename 01/26/12 0700 01/25/12 2328  GLUCAP 166* 172*    IV Fluid Intake:     . sodium chloride 75 mL/hr at 01/25/12 2155    MEDICATIONS    . aspirin EC  81 mg Oral Daily  . atorvastatin  80 mg Oral q1800  . clopidogrel  75 mg Oral Q breakfast  . enoxaparin (LOVENOX) injection  40 mg Subcutaneous Q24H  . insulin aspart  0-9 Units Subcutaneous TID WC  . [COMPLETED] labetalol  5 mg Intravenous Once  . lisinopril  20 mg Oral Daily  . metoprolol tartrate  25 mg Oral BID  . nicotine  21 mg Transdermal Daily  . pantoprazole  40 mg Oral Q0600  . thiamine  100 mg Oral Daily  . [DISCONTINUED] aspirin EC  81  mg Oral Daily  . [DISCONTINUED] enoxaparin  40 mg Subcutaneous Q24H   PRN:  ondansetron (ZOFRAN) IV, senna-docusate  Diet:  Carb Control s Activity:  Bedrest  DVT Prophylaxis:  SCDs  CLINICALLY SIGNIFICANT STUDIES Basic Metabolic Panel:  Lab 01/26/12 2841 01/25/12 2029 01/25/12 1325  NA 137 -- 137  K 4.1 -- 4.1  CL 100 -- 100  CO2 27 -- 28  GLUCOSE 211* -- 130*  BUN 13 -- 17  CREATININE 0.70 0.74 --  CALCIUM 9.3 -- 9.6  MG -- -- --  PHOS -- -- --   Liver Function Tests:  Lab 01/25/12 1325  AST 26  ALT 18  ALKPHOS 147*  BILITOT 0.4  PROT 7.2  ALBUMIN 3.7   CBC:  Lab 01/26/12 0620 01/25/12 2029 01/25/12 1325  WBC 6.0 7.3 --  NEUTROABS -- -- 4.3  HGB 13.3 13.0 --  HCT 39.7 39.6 --  MCV 83.6 84.1 --  PLT 211 235 --   Coagulation:  Lab 01/25/12 1325  LABPROT 12.6  INR 0.95   Cardiac Enzymes:  Lab 01/26/12 0620 01/26/12 0036 01/25/12 1903  CKTOTAL 72 87 115  CKMB 4.8*  5.5* 6.4*  CKMBINDEX -- -- --  TROPONINI <0.30 <0.30 <0.30   Urinalysis:  Lab 01/25/12 1334  COLORURINE YELLOW  LABSPEC 1.015  PHURINE 6.0  GLUCOSEU NEGATIVE  HGBUR NEGATIVE  BILIRUBINUR NEGATIVE  KETONESUR NEGATIVE  PROTEINUR NEGATIVE  UROBILINOGEN 0.2  NITRITE NEGATIVE  LEUKOCYTESUR NEGATIVE   Lipid Panel    Component Value Date/Time   CHOL 190 01/26/2012 0620   TRIG 114 01/26/2012 0620   HDL 60 01/26/2012 0620   CHOLHDL 3.2 01/26/2012 0620   VLDL 23 01/26/2012 0620   LDLCALC 107* 01/26/2012 0620   HgbA1C  Lab Results  Component Value Date   HGBA1C 7.3* 01/25/2012    Urine Drug Screen:   No results found for this basename: labopia, cocainscrnur, labbenz, amphetmu, thcu, labbarb    Alcohol Level:  Lab 01/25/12 1325  ETH <11    Dg Eye Foreign Body  01/25/2012  *RADIOLOGY REPORT*  Clinical Data: Possible history of eye injury with metal.  Pre MRI.  ORBITS FOR FOREIGN BODY - 2 VIEW  Comparison: CT 01/25/2012  Findings: There is no evidence for intraorbital foreign body. There  is opacification of the left maxillary sinus. The patient has dental work.  IMPRESSION: No evidence for retained intraorbital metallic foreign body.   Original Report Authenticated By: Norva Pavlov, M.D.    Dg Chest 2 View  01/25/2012  *RADIOLOGY REPORT*  Clinical Data: Recent stroke  CHEST - 2 VIEW  Comparison: 09/20/2011; 06/11/2011; 05/24/2011; chest CT - 05/21/2011  Findings:  Grossly unchanged cardiac silhouette and mediastinal contours with atherosclerotic calcifications within the thoracic aorta.  Post median sternotomy and CABG. Bilateral pleural calcifications are grossly unchanged.  No new focal airspace opacities.  There is chronic mild elevation of the right hemidiaphragm.  No definite pleural effusion or pneumothorax.  Unchanged bones.  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   Original Report Authenticated By: Tacey Ruiz, MD    Ct Head Wo Contrast  01/25/2012  *RADIOLOGY REPORT*  Clinical Data: Slurred speech.  Right hand weakness.  Stumbling for 3 days.  CT HEAD WITHOUT CONTRAST  Technique:  Contiguous axial images were obtained from the base of the skull through the vertex without contrast.  Comparison: 04/15/2011  Findings: The ventricles are normal in configuration.  There is ventricular and sulcal enlargement reflecting mild atrophy.  A well- defined focus of hypoattenuation has developed at the base of the left middle cerebral peduncle in the mid brain consistent with a lacunar infarct that is chronic but new from the prior exam.  There are old cerebellar infarcts that are stable. Mild periventricular white matter hypoattenuation is also stable consistent with chronic microvascular ischemic change.  There is no evidence of a recent infarct.  There are no extra-axial masses or abnormal fluid collections.  There is no intracranial hemorrhage.  Mucous retention cyst in the left maxillary sinus.  The remaining visualized  sinuses and mastoid air cells are clear.  No skull lesion/fracture.  IMPRESSION:  No acute intracranial abnormalities.  Atrophy, mild chronic microvascular ischemic change and old infarcts as described.   Original Report Authenticated By: Amie Portland, M.D.    Mr Brain Wo Contrast  01/26/2012  *RADIOLOGY REPORT*  Clinical Data:  Stroke.  Diabetes and hypertension.  MRI HEAD WITHOUT CONTRAST MRA HEAD WITHOUT CONTRAST  Technique:  Multiplanar, multiecho pulse sequences of the brain and surrounding structures were obtained without intravenous contrast. Angiographic images of the head were obtained using MRA technique  without contrast.  Comparison:  Head CT same day  MRI HEAD  Findings:  There is a 1 cm acute infarction affecting the left pons extending towards the mid brain.  No other acute infarction.  There are old small vessel infarctions within the pons.  There are numerous old cerebellar infarctions, most extensive within the left cerebellum.  The cerebral hemispheres show an old cortical and subcortical infarction in the left occipital lobe and moderate chronic small vessel disease throughout the hemispheric white matter.  No evidence of mass lesion, hemorrhage, hydrocephalus or extra-axial collection.  No pituitary mass.  The left maxillary sinus is full of fluid.  IMPRESSION: 1 cm acute infarction in the left pons extending towards the mid brain.  Numerous other old posterior circulation infarctions most notable on the left cerebellum and left occipital region.  Chronic small vessel disease of the hemispheric deep white matter.  MRA HEAD  Findings: Both internal carotid arteries are patent into the brain. There is atherosclerotic irregularity in the siphon regions but no flow-limiting stenosis.  The anterior and middle cerebral vessels are patent without proximal stenosis, aneurysm or vascular malformation.  More distal branch vessels do show atherosclerotic irregularity.  Both vertebral arteries are patent  to the basilar.  There is mild atherosclerotic irregularity of the distal vertebral arteries but no flow-limiting stenosis.  No basilar stenosis.  Both posterior inferior cerebellar arteries are patent.  There is flow within both posterior cerebral arteries.  The right receives its supply from the anterior circulation.  There is distal vessel irregularity bilaterally.  Both superior cerebellar arteries are patent but show atherosclerotic irregularity.  IMPRESSION:  No major vessel occlusion or correctable proximal stenosis. Diffuse medium to small vessel atherosclerotic irregularity, most notable in the posterior circulation branches.   Original Report Authenticated By: Paulina Fusi, M.D.    Mr Mra Head/brain Wo Cm  01/26/2012  *RADIOLOGY REPORT*  Clinical Data:  Stroke.  Diabetes and hypertension.  MRI HEAD WITHOUT CONTRAST MRA HEAD WITHOUT CONTRAST  Technique:  Multiplanar, multiecho pulse sequences of the brain and surrounding structures were obtained without intravenous contrast. Angiographic images of the head were obtained using MRA technique without contrast.  Comparison:  Head CT same day  MRI HEAD  Findings:  There is a 1 cm acute infarction affecting the left pons extending towards the mid brain.  No other acute infarction.  There are old small vessel infarctions within the pons.  There are numerous old cerebellar infarctions, most extensive within the left cerebellum.  The cerebral hemispheres show an old cortical and subcortical infarction in the left occipital lobe and moderate chronic small vessel disease throughout the hemispheric white matter.  No evidence of mass lesion, hemorrhage, hydrocephalus or extra-axial collection.  No pituitary mass.  The left maxillary sinus is full of fluid.  IMPRESSION: 1 cm acute infarction in the left pons extending towards the mid brain.  Numerous other old posterior circulation infarctions most notable on the left cerebellum and left occipital region.  Chronic  small vessel disease of the hemispheric deep white matter.  MRA HEAD  Findings: Both internal carotid arteries are patent into the brain. There is atherosclerotic irregularity in the siphon regions but no flow-limiting stenosis.  The anterior and middle cerebral vessels are patent without proximal stenosis, aneurysm or vascular malformation.  More distal branch vessels do show atherosclerotic irregularity.  Both vertebral arteries are patent to the basilar.  There is mild atherosclerotic irregularity of the distal vertebral arteries but no flow-limiting stenosis.  No basilar stenosis.  Both posterior inferior cerebellar arteries are patent.  There is flow within both posterior cerebral arteries.  The right receives its supply from the anterior circulation.  There is distal vessel irregularity bilaterally.  Both superior cerebellar arteries are patent but show atherosclerotic irregularity.  IMPRESSION:  No major vessel occlusion or correctable proximal stenosis. Diffuse medium to small vessel atherosclerotic irregularity, most notable in the posterior circulation branches.   Original Report Authenticated By: Paulina Fusi, M.D.     CT of the brain    MRI of the brain  Left Pontine subacute infarct. Multiple old lacunar bilateral infarcts.  MRA of the brain  No large vessel stenosis  2D Echocardiogram  ordered  Carotid Doppler  ordered  CXR    EKG not done Therapy Recommendations pending Physical Exam  Pleasant elderly caucasian male not in distress.Awake alert. Afebrile. Head is nontraumatic. Neck is supple without bruit. Hearing is normal. Cardiac exam no murmur or gallop. Lungs are clear to auscultation. Distal pulses are well felt.  Neurological Exam : Awake alert oriented x 3 normal speech and language.Extraocular movements are full range. Fundi not visualized. Vision acuity and fields appear normal. Mild right lower face asymmetry. Tongue midline. No drift. Mild diminished fine finger movements  on right. Orbits left over right upper extremity. Mild right grip and hip flexor weakness.. Normal sensation . Normal coordination. Gait not tested ASSESSMENT Larry Cooper is a 71 y.o. male presenting with  dysarthria and right arm weakness x 3 days..    . Imaging confirms a left Pontine infarct. Infarct felt to be thrombotic secondary to small vessel disease  Work up underway. On aspirin 81 mg orally every day prior to admission. Now on aspirin 81 mg orally every day for secondary stroke prevention. Patient with resultant mild right sided weakness and dysarthria..   Vascular risk factors of DM,HT,Lipids ,Cerebrovascular disease and IHD  Hospital day # 1  TREATMENT/PLAN  Continue  aspirin 81 mg orally every day for secondary stroke prevention.  Check ECHO and dopplers. Add zocor for elevated LDL.  Advised patient to be compliant with his medicines.  Await therapy evaluations.   Delia Heady, MD Medical Director [facility] Pager: [phone] 01/26/2012 10:57 AM

## 2012-01-26 NOTE — Evaluation (Signed)
Physical Therapy Evaluation Patient Details Name: Larry Cooper MRN: [id number] DOB: 1940-09-26 Today's Date: 01/26/2012 Time: 4098-1191 PT Time Calculation (min): 15 min  PT Assessment / Plan / Recommendation Clinical Impression  71 y.o. male presenting with  dysarthria and right arm weakness x 3 days..    . Imaging confirms a left Pontine infarct. Pt presenting with high level balance deficits, mostly noted with distractions and increased difficulty in tasks. Pt is at an increased fall risk in the community as noted by the DGI Balance score. Pt will benefit from skilled PT in the acute care setting in order to maximize functional mobility and safety. Recommending OPPT- Neuro for high level balance for increased safety upon d/c    PT Assessment  Patient needs continued PT services    Follow Up Recommendations  Outpatient PT;Supervision for mobility/OOB    Does the patient have the potential to tolerate intense rehabilitation      Barriers to Discharge        Equipment Recommendations  Other (comment) (TBD)    Recommendations for Other Services     Frequency Min 4X/week    Precautions / Restrictions Precautions Precautions: Fall Restrictions Weight Bearing Restrictions: No   Pertinent Vitals/Pain No pain noted       Mobility  Bed Mobility Bed Mobility: Supine to Sit;Sitting - Scoot to Edge of Bed Supine to Sit: 7: Independent Sitting - Scoot to Edge of Bed: 7: Independent Transfers Transfers: Sit to Stand;Stand to Sit Sit to Stand: 5: Supervision;With upper extremity assist;From bed Stand to Sit: 5: Supervision;With upper extremity assist;To chair/3-in-1 Details for Transfer Assistance: Supervision as pt slightly unsteady althuogh did not require any asistance to maintain balance in standing Ambulation/Gait Ambulation/Gait Assistance: 4: Min guard;4: Min assist Ambulation Distance (Feet): 300 Feet Assistive device: None Ambulation/Gait Assistance Details: Minguard  for majority of ambulation, although required min assist at times as pt with right lean and increased right veering during balance test. See balance category for details Gait Pattern: Within Functional Limits Gait velocity: slow gait speed Stairs: Yes Stairs Assistance: 5: Supervision Stairs Assistance Details (indicate cue type and reason): Supervision for safety Stair Management Technique: One rail Right;Step to pattern;Forwards Number of Stairs: 12  Modified Rankin (Stroke Patients Only) Pre-Morbid Rankin Score: No significant disability Modified Rankin: Moderately severe disability    Shoulder Instructions     Exercises     PT Diagnosis: Abnormality of gait  PT Problem List: Decreased balance;Decreased activity tolerance;Decreased mobility;Decreased knowledge of use of DME;Decreased safety awareness;Decreased knowledge of precautions PT Treatment Interventions: DME instruction;Gait training;Stair training;Functional mobility training;Therapeutic activities;Balance training;Neuromuscular re-education;Patient/family education   PT Goals Acute Rehab PT Goals PT Goal Formulation: With patient Time For Goal Achievement: 02/02/12 Potential to Achieve Goals: Good Pt will go Sit to Stand: with modified independence PT Goal: Sit to Stand - Progress: Goal set today Pt will go Stand to Sit: with modified independence PT Goal: Stand to Sit - Progress: Goal set today Pt will Transfer Bed to Chair/Chair to Bed: with supervision PT Transfer Goal: Bed to Chair/Chair to Bed - Progress: Goal set today Pt will Ambulate: >150 feet;with supervision;with least restrictive assistive device PT Goal: Ambulate - Progress: Goal set today Pt will Go Up / Down Stairs: 1-2 stairs;with supervision PT Goal: Up/Down Stairs - Progress: Goal set today Additional Goals Additional Goal #1: Pt will score >19 on the DGI indiciating a decreased fall risk PT Goal: Additional Goal #1 - Progress: Goal set  today  Visit Information  Last PT Received On: 01/26/12 Assistance Needed: +1    Subjective Data  Patient Stated Goal: to go home soon   Prior Functioning  Home Living Lives With: Daughter Available Help at Discharge: Family;Available 24 hours/day Type of Home: Mobile home Home Access: Stairs to enter Entrance Stairs-Number of Steps: 3 Entrance Stairs-Rails: Can reach both Home Layout: One level Bathroom Shower/Tub: Forensic scientist: Standard Bathroom Accessibility: Yes How Accessible: Accessible via walker Home Adaptive Equipment: None Prior Function Level of Independence: Independent Able to Take Stairs?: Yes Driving: Yes Vocation: Retired Musician: Expressive difficulties Dominant Hand: Right    Cognition  Overall Cognitive Status: Appears within functional limits for tasks assessed/performed Arousal/Alertness: Awake/alert Orientation Level: Appears intact for tasks assessed Behavior During Session: [facility] for tasks performed    Extremity/Trunk Assessment Right Lower Extremity Assessment RLE ROM/Strength/Tone: Within functional levels RLE Sensation: WFL - Light Touch Left Lower Extremity Assessment LLE ROM/Strength/Tone: Within functional levels LLE Sensation: WFL - Light Touch   Balance Balance Balance Assessed: Yes Standardized Balance Assessment Standardized Balance Assessment: Dynamic Gait Index Dynamic Gait Index Level Surface: Mild Impairment Change in Gait Speed: Mild Impairment Gait with Horizontal Head Turns: Mild Impairment Gait with Vertical Head Turns: Mild Impairment Gait and Pivot Turn: Mild Impairment Step Over Obstacle: Mild Impairment Step Around Obstacles: Normal Steps: Mild Impairment Total Score: 17   End of Session PT - End of Session Equipment Utilized During Treatment: Gait belt Activity Tolerance: Patient tolerated treatment well Patient left: in chair;with call bell/phone within  reach Nurse Communication: Mobility status  GP Functional Assessment Tool Used: clinical judgement Functional Limitation: Mobility: Walking and moving around Mobility: Walking and Moving Around Current Status (W4132): At least 20 percent but less than 40 percent impaired, limited or restricted Mobility: Walking and Moving Around Goal Status [phone]): At least 1 percent but less than 20 percent impaired, limited or restricted   Milana Kidney 01/26/2012, 4:56 PM  01/26/2012 Milana Kidney DPT PAGER: [phone] OFFICE: [phone]

## 2012-01-26 NOTE — Progress Notes (Signed)
Bilateral:  40-59% internal carotid artery stenosis, probably due to vessel tortuosity.

## 2012-01-26 NOTE — Evaluation (Signed)
Speech Language Pathology Evaluation Patient Details Name: Larry Cooper MRN: [id number] DOB: [birth year] Today's Date: 01/26/2012 Time: 4098-1191 SLP Time Calculation (min): 42 min  Problem List:  Patient Active Problem List  Diagnosis  . Coronary atherosclerosis of native coronary artery  . Acute myocardial infarction, subendocardial infarction, initial episode of care  . Type II or unspecified type diabetes mellitus without mention of complication, not stated as uncontrolled  . H/O  . Tobacco abuse  . Asbestosis  . Weakness  . Slurred speech  . HTN (hypertension)  . Hyperlipidemia  . CVA (cerebral infarction)   Past Medical History:  Past Medical History  Diagnosis Date  . Diabetes mellitus   . Stroke   . Myocardial infarct   . Alcohol abuse   . Coronary artery disease   . Asbestosis   . HTN (hypertension) 01/25/2012  . Hyperlipidemia 01/25/2012   Past Surgical History:  Past Surgical History  Procedure Date  . Heart stents   . Appendectomy   . Coronary artery bypass graft 05/22/2011    Procedure: CORONARY ARTERY BYPASS GRAFTING (CABG);  Surgeon: Delight Ovens, MD;  Location: [facility] OR;  Service: Open Heart Surgery;  Laterality: N/A;  Times 6 using endoscopically harvested right greater saphenous vein and left internal mammory artery.    HPI:  71 yo male adm to [facility] with right sided weakness and speech deficits.  PMH + for CVAs' pt reports last one being 2001.  Pt CT head 01/26/12 showed old stable cerebellar infarct, lacunar cva-chronic but new from exam exam 04/15/11.   MRI showed left pontine subacute CVA.  Stroke team orders placed which included slp, pt, ot.  Pt PMH + for asbestos exposure, smoking, cva 2001, CAD s/p CABG, DM.  Pt reported to this slp that he stopped taking his prescription medications due to lack of funds, he did however take his aspirin.  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     Assessment / Plan / Recommendation Clinical Impression  Pt presents with moderate mixed dysarthria impacting speech intelligiblity- most notably at multisyllabic word and sentence level.  Pt is using his own compensatory strategies of slowing rate which improves his intelligiblity and he responds appropriately when listener does not understand him.  Cog-ling skills appear functional/fluent as pt was able to have high level conversation re: medical hx, family situation, etc.    SLP provided compenatory strategies for dysarthria in writing, tasks to assess intelligibilty, and reviewed symptoms of stroke.  Pt verbalized understanding to information provided.     Please note, pt states he can not afford his medications and has not taken his prescriptions for one month.  Neuro MD advised pt that many generics can be given and filled at [facility] for $4 a month.       SLP to sign off as all education is completed.        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  Overall Cognitive Status: Appears within functional limits for tasks assessed Orientation Level: Oriented X4 Attention: Alternating (pt able to transition attention to slp and neuro MD well) Memory: Appears intact (able to recall medical events prior to admit) Executive Function:  (concern given his lack of compliance with medication)    Comprehension  Auditory Comprehension Overall  Auditory Comprehension: Appears within functional limits for tasks assessed Commands: Within Functional Limits (multistep) Conversation: Complex (wfl) Visual Recognition/Discrimination Discrimination: Within Function Limits Reading Comprehension Reading Status: Within funtional limits    Expression Expression Primary Mode of Expression: Verbal Verbal Expression Overall Verbal Expression: Appears within functional limits for tasks assessed (except dysarthria) Initiation: No impairment Repetition: No impairment Naming: No impairment Pragmatics: No impairment Written Expression Dominant Hand: Right Written Expression: Not tested (not tested due to pt to see ot- defer to ot)   Oral / Motor Oral Motor/Sensory Function Labial ROM: Within Functional Limits Labial Symmetry: Within Functional Limits Labial Strength: Within Functional Limits Labial Sensation: Within Functional Limits Lingual ROM: Reduced right Lingual Symmetry: Within Functional Limits Lingual Strength: Reduced Lingual Sensation: Within Functional Limits Facial ROM: Within Functional Limits Facial Symmetry: Within Functional Limits Facial Strength: Within Functional Limits Facial Sensation: Within Functional Limits Velum: Within Functional Limits Mandible: Within Functional Limits Motor Speech Overall Motor Speech: Impaired Respiration: Impaired Level of Impairment: Conversation Phonation: Normal Resonance: Within functional limits Articulation: Impaired Intelligibility: Intelligibility reduced Word: 75-100% accurate Phrase: 75-100% accurate Sentence: 75-100% accurate Conversation: 50-74% accurate Motor Planning: Witnin functional limits Motor Speech Errors: Consistent Effective Techniques: Slow rate;Pause;Over-articulate   GO Functional Assessment Tool Used: clinical assessment Functional Limitations: Motor speech Motor Speech Current Status [phone]): At least 20 percent but less than 40 percent impaired,  limited or restricted Motor Speech Goal Status [phone]): At least 20 percent but less than 40 percent impaired, limited or restricted Motor Speech Goal Status [phone]): At least 20 percent but less than 40 percent impaired, limited or restricted   Donavan Burnet, MS [facility] SLP [phone]

## 2012-01-27 DIAGNOSIS — I059 Rheumatic mitral valve disease, unspecified: Secondary | ICD-10-CM

## 2012-01-27 LAB — CK TOTAL AND CKMB (NOT AT ARMC)
CK, MB: 3.8 ng/mL (ref 0.3–4.0)
CK, MB: 4.3 ng/mL — ABNORMAL HIGH (ref 0.3–4.0)
Relative Index: INVALID (ref 0.0–2.5)
Relative Index: INVALID (ref 0.0–2.5)
Total CK: 51 U/L (ref 7–232)

## 2012-01-27 MED ORDER — METOPROLOL TARTRATE 25 MG PO TABS: 25.0000 mg | ORAL_TABLET | Freq: Two times a day (BID) | ORAL | Status: AC

## 2012-01-27 MED ORDER — CLOPIDOGREL BISULFATE 75 MG PO TABS: 75.0000 mg | ORAL_TABLET | Freq: Every day | ORAL | Status: AC

## 2012-01-27 MED ORDER — METFORMIN HCL 1000 MG PO TABS: 500.0000 mg | ORAL_TABLET | Freq: Two times a day (BID) | ORAL | Status: DC

## 2012-01-27 MED ORDER — LISINOPRIL 20 MG PO TABS: 20.0000 mg | ORAL_TABLET | Freq: Every day | ORAL | Status: DC

## 2012-01-27 MED ORDER — ATORVASTATIN CALCIUM 80 MG PO TABS: 80.0000 mg | ORAL_TABLET | Freq: Every day | ORAL | Status: AC

## 2012-01-27 MED ORDER — ASPIRIN 81 MG PO TBEC: 81.0000 mg | DELAYED_RELEASE_TABLET | Freq: Every day | ORAL | Status: DC

## 2012-01-27 MED ORDER — INSULIN ASPART PROT & ASPART (70-30 MIX) 100 UNIT/ML ~~LOC~~ SUSP: 30.0000 [IU] | Freq: Two times a day (BID) | SUBCUTANEOUS | Status: DC

## 2012-01-27 NOTE — Progress Notes (Signed)
Stroke Team Progress Note  HISTORY Larry Cooper is a 71 y.o. male who was in his normal state of health up until 3 days ago at which point he felt lightheaded. He was working in the yard, and came in to light his blood sugar was low so he ate something and lay down to sleep. When he awoke, he had right arm weakness and slurred speech. He also noted some tingling of his right hand at that time. Since that time, he feels for the right-sided weakness has improved, but still has some trouble writing. He continued to have slurred speech, however and therefore presented on 01/25/12.  Patient was not a TPA candidate secondary to  Delay in arrival He was admitted to the neuro unit for further evaluation and treatment.He has h/o multiple strokes and TIAs x 10 years. He states he stopped all his medicines 1 month ago as he ran out and could afford them.  SUBJECTIVE    Overall he feels his condition is  Improving. Therapy has seen him and recommends out patient therapies only.  OBJECTIVE Most recent Vital Signs: Filed Vitals:   01/26/12 2155 01/27/12 0200 01/27/12 0600 01/27/12 1012  BP: 152/64 160/62 185/76 176/62  Pulse: 57 56 54 52  Temp: 98.1 F (36.7 C) 98.2 F (36.8 C) 97.8 F (36.6 C) 98 F (36.7 C)  TempSrc: Oral Oral Oral Oral  Resp: 18 18 18 17   Height:      Weight:      SpO2: 100% 99% 100% 100%   CBG (last 3)   Basename 01/27/12 0654 01/26/12 2105 01/26/12 1615  GLUCAP 209* 157* 277*    IV Fluid Intake:      . [EXPIRED] sodium chloride 75 mL/hr at 01/25/12 2155    MEDICATIONS     . aspirin EC  81 mg Oral Daily  . atorvastatin  80 mg Oral q1800  . clopidogrel  75 mg Oral Q breakfast  . enoxaparin (LOVENOX) injection  40 mg Subcutaneous Q24H  . insulin aspart  0-9 Units Subcutaneous TID WC  . lisinopril  20 mg Oral Daily  . metoprolol tartrate  25 mg Oral BID  . nicotine  21 mg Transdermal Daily  . pantoprazole  40 mg Oral Q0600  . thiamine  100 mg Oral Daily   PRN:   ondansetron (ZOFRAN) IV, senna-docusate  Diet:  Carb Control s Activity:  Bedrest  DVT Prophylaxis:  SCDs  CLINICALLY SIGNIFICANT STUDIES Basic Metabolic Panel:   Lab 01/26/12 0620 01/25/12 2029 01/25/12 1325  NA 137 -- 137  K 4.1 -- 4.1  CL 100 -- 100  CO2 27 -- 28  GLUCOSE 211* -- 130*  BUN 13 -- 17  CREATININE 0.70 0.74 --  CALCIUM 9.3 -- 9.6  MG -- -- --  PHOS -- -- --   Liver Function Tests:   Lab 01/25/12 1325  AST 26  ALT 18  ALKPHOS 147*  BILITOT 0.4  PROT 7.2  ALBUMIN 3.7   CBC:   Lab 01/26/12 0620 01/25/12 2029 01/25/12 1325  WBC 6.0 7.3 --  NEUTROABS -- -- 4.3  HGB 13.3 13.0 --  HCT 39.7 39.6 --  MCV 83.6 84.1 --  PLT 211 235 --   Coagulation:   Lab 01/25/12 1325  LABPROT 12.6  INR 0.95   Cardiac Enzymes:   Lab 01/27/12 0805 01/27/12 0040 01/26/12 1820 01/26/12 0620 01/26/12 0036 01/25/12 1903  CKTOTAL 51 51 57 -- -- --  CKMB 4.0 3.8  3.7 -- -- --  CKMBINDEX -- -- -- -- -- --  TROPONINI -- -- -- <0.30 <0.30 <0.30   Urinalysis:   Lab 01/25/12 1334  COLORURINE YELLOW  LABSPEC 1.015  PHURINE 6.0  GLUCOSEU NEGATIVE  HGBUR NEGATIVE  BILIRUBINUR NEGATIVE  KETONESUR NEGATIVE  PROTEINUR NEGATIVE  UROBILINOGEN 0.2  NITRITE NEGATIVE  LEUKOCYTESUR NEGATIVE   Lipid Panel    Component Value Date/Time   CHOL 190 01/26/2012 0620   TRIG 114 01/26/2012 0620   HDL 60 01/26/2012 0620   CHOLHDL 3.2 01/26/2012 0620   VLDL 23 01/26/2012 0620   LDLCALC 107* 01/26/2012 0620   HgbA1C  Lab Results  Component Value Date   HGBA1C 7.4* 01/26/2012    Urine Drug Screen:   No results found for this basename: labopia,  cocainscrnur,  labbenz,  amphetmu,  thcu,  labbarb    Alcohol Level:   Lab 01/25/12 1325  ETH <11    Dg Eye Foreign Body  01/25/2012  *RADIOLOGY REPORT*  Clinical Data: Possible history of eye injury with metal.  Pre MRI.  ORBITS FOR FOREIGN BODY - 2 VIEW  Comparison: CT 01/25/2012  Findings: There is no evidence for intraorbital  foreign body. There is opacification of the left maxillary sinus. The patient has dental work.  IMPRESSION: No evidence for retained intraorbital metallic foreign body.   Original Report Authenticated By: Norva Pavlov, M.D.    Dg Chest 2 View  01/25/2012  *RADIOLOGY REPORT*  Clinical Data: Recent stroke  CHEST - 2 VIEW  Comparison: 09/20/2011; 06/11/2011; 05/24/2011; chest CT - 05/21/2011  Findings:  Grossly unchanged cardiac silhouette and mediastinal contours with atherosclerotic calcifications within the thoracic aorta.  Post median sternotomy and CABG. Bilateral pleural calcifications are grossly unchanged.  No new focal airspace opacities.  There is chronic mild elevation of the right hemidiaphragm.  No definite pleural effusion or pneumothorax.  Unchanged bones.  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   Original Report Authenticated By: Tacey Ruiz, MD    Ct Head Wo Contrast  01/25/2012  *RADIOLOGY REPORT*  Clinical Data: Slurred speech.  Right hand weakness.  Stumbling for 3 days.  CT HEAD WITHOUT CONTRAST  Technique:  Contiguous axial images were obtained from the base of the skull through the vertex without contrast.  Comparison: 04/15/2011  Findings: The ventricles are normal in configuration.  There is ventricular and sulcal enlargement reflecting mild atrophy.  A well- defined focus of hypoattenuation has developed at the base of the left middle cerebral peduncle in the mid brain consistent with a lacunar infarct that is chronic but new from the prior exam.  There are old cerebellar infarcts that are stable. Mild periventricular white matter hypoattenuation is also stable consistent with chronic microvascular ischemic change.  There is no evidence of a recent infarct.  There are no extra-axial masses or abnormal fluid collections.  There is no intracranial hemorrhage.  Mucous retention cyst in the left maxillary sinus.  The  remaining visualized sinuses and mastoid air cells are clear.  No skull lesion/fracture.  IMPRESSION:  No acute intracranial abnormalities.  Atrophy, mild chronic microvascular ischemic change and old infarcts as described.   Original Report Authenticated By: Amie Portland, M.D.    Mr Brain Wo Contrast  01/26/2012  *RADIOLOGY REPORT*  Clinical Data:  Stroke.  Diabetes and hypertension.  MRI HEAD WITHOUT CONTRAST MRA HEAD WITHOUT CONTRAST  Technique:  Multiplanar, multiecho pulse sequences of the brain and surrounding structures  were obtained without intravenous contrast. Angiographic images of the head were obtained using MRA technique without contrast.  Comparison:  Head CT same day  MRI HEAD  Findings:  There is a 1 cm acute infarction affecting the left pons extending towards the mid brain.  No other acute infarction.  There are old small vessel infarctions within the pons.  There are numerous old cerebellar infarctions, most extensive within the left cerebellum.  The cerebral hemispheres show an old cortical and subcortical infarction in the left occipital lobe and moderate chronic small vessel disease throughout the hemispheric white matter.  No evidence of mass lesion, hemorrhage, hydrocephalus or extra-axial collection.  No pituitary mass.  The left maxillary sinus is full of fluid.  IMPRESSION: 1 cm acute infarction in the left pons extending towards the mid brain.  Numerous other old posterior circulation infarctions most notable on the left cerebellum and left occipital region.  Chronic small vessel disease of the hemispheric deep white matter.  MRA HEAD  Findings: Both internal carotid arteries are patent into the brain. There is atherosclerotic irregularity in the siphon regions but no flow-limiting stenosis.  The anterior and middle cerebral vessels are patent without proximal stenosis, aneurysm or vascular malformation.  More distal branch vessels do show atherosclerotic irregularity.  Both vertebral  arteries are patent to the basilar.  There is mild atherosclerotic irregularity of the distal vertebral arteries but no flow-limiting stenosis.  No basilar stenosis.  Both posterior inferior cerebellar arteries are patent.  There is flow within both posterior cerebral arteries.  The right receives its supply from the anterior circulation.  There is distal vessel irregularity bilaterally.  Both superior cerebellar arteries are patent but show atherosclerotic irregularity.  IMPRESSION:  No major vessel occlusion or correctable proximal stenosis. Diffuse medium to small vessel atherosclerotic irregularity, most notable in the posterior circulation branches.   Original Report Authenticated By: Paulina Fusi, M.D.    Mr Mra Head/brain Wo Cm  01/26/2012  *RADIOLOGY REPORT*  Clinical Data:  Stroke.  Diabetes and hypertension.  MRI HEAD WITHOUT CONTRAST MRA HEAD WITHOUT CONTRAST  Technique:  Multiplanar, multiecho pulse sequences of the brain and surrounding structures were obtained without intravenous contrast. Angiographic images of the head were obtained using MRA technique without contrast.  Comparison:  Head CT same day  MRI HEAD  Findings:  There is a 1 cm acute infarction affecting the left pons extending towards the mid brain.  No other acute infarction.  There are old small vessel infarctions within the pons.  There are numerous old cerebellar infarctions, most extensive within the left cerebellum.  The cerebral hemispheres show an old cortical and subcortical infarction in the left occipital lobe and moderate chronic small vessel disease throughout the hemispheric white matter.  No evidence of mass lesion, hemorrhage, hydrocephalus or extra-axial collection.  No pituitary mass.  The left maxillary sinus is full of fluid.  IMPRESSION: 1 cm acute infarction in the left pons extending towards the mid brain.  Numerous other old posterior circulation infarctions most notable on the left cerebellum and left occipital  region.  Chronic small vessel disease of the hemispheric deep white matter.  MRA HEAD  Findings: Both internal carotid arteries are patent into the brain. There is atherosclerotic irregularity in the siphon regions but no flow-limiting stenosis.  The anterior and middle cerebral vessels are patent without proximal stenosis, aneurysm or vascular malformation.  More distal branch vessels do show atherosclerotic irregularity.  Both vertebral arteries are patent to the basilar.  There is mild atherosclerotic irregularity of the distal vertebral arteries but no flow-limiting stenosis.  No basilar stenosis.  Both posterior inferior cerebellar arteries are patent.  There is flow within both posterior cerebral arteries.  The right receives its supply from the anterior circulation.  There is distal vessel irregularity bilaterally.  Both superior cerebellar arteries are patent but show atherosclerotic irregularity.  IMPRESSION:  No major vessel occlusion or correctable proximal stenosis. Diffuse medium to small vessel atherosclerotic irregularity, most notable in the posterior circulation branches.   Original Report Authenticated By: Paulina Fusi, M.D.     CT of the brain  As above  MRI of the brain  Left Pontine subacute infarct. Multiple old lacunar bilateral infarcts.  MRA of the brain  No large vessel stenosis  2D Echocardiogram  ordered  Carotid Doppler  40-59 5 bilateral stenosis  CXR    EKG not done Therapy Recommendations pending Physical Exam  Pleasant elderly caucasian male not in distress.Awake alert. Afebrile. Head is nontraumatic. Neck is supple without bruit. Hearing is normal. Cardiac exam no murmur or gallop. Lungs are clear to auscultation. Distal pulses are well felt.  Neurological Exam : Awake alert oriented x 3 normal speech and language.Extraocular movements are full range. Fundi not visualized. Vision acuity and fields appear normal. Mild right lower face asymmetry. Tongue midline. No  drift. Mild diminished fine finger movements on right. Orbits left over right upper extremity. Mild right grip and hip flexor weakness.. Normal sensation . Normal coordination. Gait not tested ASSESSMENT Mr. Larry Cooper is a 71 y.o. male presenting with  dysarthria and right arm weakness x 3 days..    . Imaging confirms a left Pontine infarct. Infarct felt to be thrombotic secondary to small vessel disease  Work up underway. On aspirin 81 mg orally every day prior to admission. Now on aspirin 81 mg orally every day for secondary stroke prevention. Patient with resultant mild right sided weakness and dysarthria..   Vascular risk factors of DM,HT,Lipids ,Cerebrovascular disease and IHD  Hospital day # 2  TREATMENT/PLAN  Continue  aspirin 81 mg orally every day for secondary stroke prevention.  Check ECHO    Advised patient to be compliant with his medicines. Out patient Pt/OT at discharge  Delia Heady, MD Medical Director [facility] Pager: [phone] 01/27/2012 10:36 AM

## 2012-01-27 NOTE — Progress Notes (Addendum)
Physician Discharge Summary  NATHANAL HERMIZ Cooper:[id number] DOB: 1940/09/15 DOA: 01/25/2012  PCP: Elby Showers, MD  Admit date: 01/25/2012 Discharge date: 01/27/2012  Time spent: 45 minutes  Recommendations for Outpatient Follow-up:  1. F/U Neurology in 2 months 2. Outpatient PT and OT  Discharge Diagnoses:  Principal Problem:  *Slurred speech Active Problems:  Coronary atherosclerosis of native coronary artery  Type II or unspecified type diabetes mellitus without mention of complication, not stated as uncontrolled  H/O  Tobacco abuse  Weakness  HTN (hypertension)  Hyperlipidemia  CVA (cerebral infarction)   Discharge Condition: Stable  Diet recommendation: heart healthy, low salt diet  Filed Weights   01/25/12 2030  Weight: 85.866 kg (189 lb 4.8 oz)    History of present illness:  Larry Cooper is a 71 y.o. male with history of multiple strokes, coronary artery disease status post CABG March of 2013, diabetes mellitus, hypertension, ongoing tobacco abuse, hyperlipidemia, who presents to the ED with right upper extremity weakness and slurred speech x3 days. Patient states he was in the garden work and felt weak and diaphoretic and subsequently went inside check his blood glucose levels in the lower 50s. Patient states he ate a piece of candy fell asleep for about an hour. Patient states on awakening he noted his right arm weakness and some tingling in the tips of his fingers with some associated slurred speech. Patient states still has some tingling in his fingers is still with slurred speech. Patient stated that his family had been trying to convince him to come to the emergency room and he finally did today after dropping his son of for detox. Patient denies any fever, no chest pain, no shortness of breath, no nausea, no vomiting, no abdominal pain, no diarrhea, no constipation, no cough, no weakness, patient endorses bilateral lower extremity tingling and numbness in his  toes. Patient was seen in the ED head CT which was done was negative. Were called to admit the patient for further evaluation and management.    Hospital Course:  CVA Left pontine infarct  Patient was seen by neurology,  MRI brain showed left pontine infarct. At this time the symptoms have improved. Patient will get outpatient physical therapy. He will continue with aspirin for secondary stroke prevention.   Hypertension  BP is stable  Continue Lisinopril, metoprolol   Diabetes mellitus  Will continue with Novolog 70/30 at home dose. Patient's blood glucose was in 200's in the hospital, will send him on home dose, he needs to follow up with pcp to adjust the dose.  coronary artery disease status post CABG 3/ 2013  Continue aspirin, Plavix, atorvastatin, metoprolol, lisinopril.   Hyperlipidemia  Continue Atorvastatin     Procedures:  Echocardiogram  Carotid doppler   Consultations:  Neurology   Discharge Exam: Filed Vitals:   01/26/12 2155 01/27/12 0200 01/27/12 0600 01/27/12 1012  BP: 152/64 160/62 185/76 176/62  Pulse: 57 56 54 52  Temp: 98.1 F (36.7 C) 98.2 F (36.8 C) 97.8 F (36.6 C) 98 F (36.7 C)  TempSrc: Oral Oral Oral Oral  Resp: 18 18 18 17   Height:      Weight:      SpO2: 100% 99% 100% 100%    General: In no acute distress Cardiovascular: S1s2 rrr Respiratory:  Clear bilaterally Neuro:Slight weakness on right  Discharge Instructions  Discharge Orders    Future Orders Please Complete By Expires   Diet - low sodium heart healthy      Increase  activity slowly      Discharge instructions      Comments:   Outpatient physical therapy       Medication List     As of 01/27/2012  2:00 PM    TAKE these medications         aspirin 81 MG EC tablet   Take 1 tablet (81 mg total) by mouth daily.      atorvastatin 80 MG tablet   Commonly known as: LIPITOR   Take 1 tablet (80 mg total) by mouth daily at 6 PM.      clopidogrel 75 MG tablet    Commonly known as: PLAVIX   Take 1 tablet (75 mg total) by mouth daily with breakfast.      insulin aspart protamine-insulin aspart (70-30) 100 UNIT/ML injection   Commonly known as: NOVOLOG 70/30   Inject 30 Units into the skin 2 (two) times daily with a meal.      lisinopril 20 MG tablet   Commonly known as: PRINIVIL,ZESTRIL   Take 1 tablet (20 mg total) by mouth daily.      metFORMIN 1000 MG tablet   Commonly known as: GLUCOPHAGE   Take 0.5 tablets (500 mg total) by mouth 2 (two) times daily with a meal.      metoprolol tartrate 25 MG tablet   Commonly known as: LOPRESSOR   Take 1 tablet (25 mg total) by mouth 2 (two) times daily.      multivitamin with minerals Tabs   Take 1 tablet by mouth daily.      RABEprazole 20 MG tablet   Commonly known as: ACIPHEX   Take 20 mg by mouth daily.      thiamine 100 MG tablet   Take 1 tablet (100 mg total) by mouth daily.      VITAMIN B 12 PO   Take 1 tablet by mouth daily.      VITAMIN B-6 PO   Take 1 tablet by mouth daily.      VITAMIN C PO   Take 1 tablet by mouth daily.          The results of significant diagnostics from this hospitalization (including imaging, microbiology, ancillary and laboratory) are listed below for reference.    Significant Diagnostic Studies: Dg Eye Foreign Body  01/25/2012  *RADIOLOGY REPORT*  Clinical Data: Possible history of eye injury with metal.  Pre MRI.  ORBITS FOR FOREIGN BODY - 2 VIEW  Comparison: CT 01/25/2012  Findings: There is no evidence for intraorbital foreign body. There is opacification of the left maxillary sinus. The patient has dental work.  IMPRESSION: No evidence for retained intraorbital metallic foreign body.   Original Report Authenticated By: Norva Pavlov, M.D.    Dg Chest 2 View  01/25/2012  *RADIOLOGY REPORT*  Clinical Data: Recent stroke  CHEST - 2 VIEW  Comparison: 09/20/2011; 06/11/2011; 05/24/2011; chest CT - 05/21/2011  Findings:  Grossly unchanged cardiac  silhouette and mediastinal contours with atherosclerotic calcifications within the thoracic aorta.  Post median sternotomy and CABG. Bilateral pleural calcifications are grossly unchanged.  No new focal airspace opacities.  There is chronic mild elevation of the right hemidiaphragm.  No definite pleural effusion or pneumothorax.  Unchanged bones.  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   Original Report Authenticated By: Tacey Ruiz, MD    Ct Head Wo Contrast  01/25/2012  *RADIOLOGY REPORT*  Clinical Data: Slurred speech.  Right hand weakness.  Stumbling for 3 days.  CT HEAD WITHOUT CONTRAST  Technique:  Contiguous axial images were obtained from the base of the skull through the vertex without contrast.  Comparison: 04/15/2011  Findings: The ventricles are normal in configuration.  There is ventricular and sulcal enlargement reflecting mild atrophy.  A well- defined focus of hypoattenuation has developed at the base of the left middle cerebral peduncle in the mid brain consistent with a lacunar infarct that is chronic but new from the prior exam.  There are old cerebellar infarcts that are stable. Mild periventricular white matter hypoattenuation is also stable consistent with chronic microvascular ischemic change.  There is no evidence of a recent infarct.  There are no extra-axial masses or abnormal fluid collections.  There is no intracranial hemorrhage.  Mucous retention cyst in the left maxillary sinus.  The remaining visualized sinuses and mastoid air cells are clear.  No skull lesion/fracture.  IMPRESSION:  No acute intracranial abnormalities.  Atrophy, mild chronic microvascular ischemic change and old infarcts as described.   Original Report Authenticated By: Amie Portland, M.D.    Mr Brain Wo Contrast  01/26/2012  *RADIOLOGY REPORT*  Clinical Data:  Stroke.  Diabetes and hypertension.  MRI HEAD WITHOUT CONTRAST MRA HEAD WITHOUT  CONTRAST  Technique:  Multiplanar, multiecho pulse sequences of the brain and surrounding structures were obtained without intravenous contrast. Angiographic images of the head were obtained using MRA technique without contrast.  Comparison:  Head CT same day  MRI HEAD  Findings:  There is a 1 cm acute infarction affecting the left pons extending towards the mid brain.  No other acute infarction.  There are old small vessel infarctions within the pons.  There are numerous old cerebellar infarctions, most extensive within the left cerebellum.  The cerebral hemispheres show an old cortical and subcortical infarction in the left occipital lobe and moderate chronic small vessel disease throughout the hemispheric white matter.  No evidence of mass lesion, hemorrhage, hydrocephalus or extra-axial collection.  No pituitary mass.  The left maxillary sinus is full of fluid.  IMPRESSION: 1 cm acute infarction in the left pons extending towards the mid brain.  Numerous other old posterior circulation infarctions most notable on the left cerebellum and left occipital region.  Chronic small vessel disease of the hemispheric deep white matter.  MRA HEAD  Findings: Both internal carotid arteries are patent into the brain. There is atherosclerotic irregularity in the siphon regions but no flow-limiting stenosis.  The anterior and middle cerebral vessels are patent without proximal stenosis, aneurysm or vascular malformation.  More distal branch vessels do show atherosclerotic irregularity.  Both vertebral arteries are patent to the basilar.  There is mild atherosclerotic irregularity of the distal vertebral arteries but no flow-limiting stenosis.  No basilar stenosis.  Both posterior inferior cerebellar arteries are patent.  There is flow within both posterior cerebral arteries.  The right receives its supply from the anterior circulation.  There is distal vessel irregularity bilaterally.  Both superior cerebellar arteries are  patent but show atherosclerotic irregularity.  IMPRESSION:  No major vessel occlusion or correctable proximal stenosis. Diffuse medium to small vessel atherosclerotic irregularity, most notable in the posterior circulation branches.   Original Report Authenticated By: Paulina Fusi, M.D.    Mr Mra Head/brain Wo Cm  01/26/2012  *RADIOLOGY REPORT*  Clinical Data:  Stroke.  Diabetes and hypertension.  MRI HEAD WITHOUT CONTRAST MRA HEAD WITHOUT CONTRAST  Technique:  Multiplanar, multiecho pulse sequences of the brain and surrounding structures  were obtained without intravenous contrast. Angiographic images of the head were obtained using MRA technique without contrast.  Comparison:  Head CT same day  MRI HEAD  Findings:  There is a 1 cm acute infarction affecting the left pons extending towards the mid brain.  No other acute infarction.  There are old small vessel infarctions within the pons.  There are numerous old cerebellar infarctions, most extensive within the left cerebellum.  The cerebral hemispheres show an old cortical and subcortical infarction in the left occipital lobe and moderate chronic small vessel disease throughout the hemispheric white matter.  No evidence of mass lesion, hemorrhage, hydrocephalus or extra-axial collection.  No pituitary mass.  The left maxillary sinus is full of fluid.  IMPRESSION: 1 cm acute infarction in the left pons extending towards the mid brain.  Numerous other old posterior circulation infarctions most notable on the left cerebellum and left occipital region.  Chronic small vessel disease of the hemispheric deep white matter.  MRA HEAD  Findings: Both internal carotid arteries are patent into the brain. There is atherosclerotic irregularity in the siphon regions but no flow-limiting stenosis.  The anterior and middle cerebral vessels are patent without proximal stenosis, aneurysm or vascular malformation.  More distal branch vessels do show atherosclerotic irregularity.   Both vertebral arteries are patent to the basilar.  There is mild atherosclerotic irregularity of the distal vertebral arteries but no flow-limiting stenosis.  No basilar stenosis.  Both posterior inferior cerebellar arteries are patent.  There is flow within both posterior cerebral arteries.  The right receives its supply from the anterior circulation.  There is distal vessel irregularity bilaterally.  Both superior cerebellar arteries are patent but show atherosclerotic irregularity.  IMPRESSION:  No major vessel occlusion or correctable proximal stenosis. Diffuse medium to small vessel atherosclerotic irregularity, most notable in the posterior circulation branches.   Original Report Authenticated By: Paulina Fusi, M.D.    Carotid doppler:Bilateral: 40-59% internal carotid artery stenosis, probably due to vessel tortuosity.   Microbiology: No results found for this or any previous visit (from the past 240 hour(s)).   Labs: Basic Metabolic Panel:  Lab 01/26/12 9604 01/25/12 2029 01/25/12 1325  NA 137 -- 137  K 4.1 -- 4.1  CL 100 -- 100  CO2 27 -- 28  GLUCOSE 211* -- 130*  BUN 13 -- 17  CREATININE 0.70 0.74 0.74  CALCIUM 9.3 -- 9.6  MG -- -- --  PHOS -- -- --   Liver Function Tests:  Lab 01/25/12 1325  AST 26  ALT 18  ALKPHOS 147*  BILITOT 0.4  PROT 7.2  ALBUMIN 3.7   No results found for this basename: LIPASE:5,AMYLASE:5 in the last 168 hours No results found for this basename: AMMONIA:5 in the last 168 hours CBC:  Lab 01/26/12 0620 01/25/12 2029 01/25/12 1325  WBC 6.0 7.3 6.5  NEUTROABS -- -- 4.3  HGB 13.3 13.0 13.4  HCT 39.7 39.6 40.5  MCV 83.6 84.1 84.2  PLT 211 235 250   Cardiac Enzymes:  Lab 01/27/12 1251 01/27/12 0805 01/27/12 0040 01/26/12 1820 01/26/12 1220 01/26/12 0620 01/26/12 0036 01/25/12 1903 01/25/12 1325  CKTOTAL 56 51 51 57 61 -- -- -- --  CKMB 4.3* 4.0 3.8 3.7 4.1* -- -- -- --  CKMBINDEX -- -- -- -- -- -- -- -- --  TROPONINI -- -- -- -- -- <0.30  <0.30 <0.30 <0.30   BNP: BNP (last 3 results) No results found for this basename: PROBNP:3 in the  last 8760 hours CBG:  Lab 01/27/12 1119 01/27/12 0654 01/26/12 2105 01/26/12 1615 01/26/12 1144  GLUCAP 227* 209* 157* 277* 230*       Signed:  Kimiyah Blick S  [facility] 01/27/2012, 2:00 PM

## 2012-01-27 NOTE — Progress Notes (Signed)
  Echocardiogram 2D Echocardiogram has been performed.  Larry Cooper 01/27/2012, 12:05 PM

## 2012-01-27 NOTE — Progress Notes (Signed)
Pt discharged home, instructions, prescriptions given to patient.  Stress importance of taking his medications, and following diet.

## 2012-02-06 NOTE — Discharge Summary (Signed)
Larry Cooper YNW:[id number] DOB: Oct 11, 1940 DOA: 01/25/2012  PCP: Elby Showers, MD  Admit date: 01/25/2012  Discharge date: 01/27/2012  Time spent: 45 minutes  Recommendations for Outpatient Follow-up:  1. F/U Neurology in 2 months 2. Outpatient PT and OT Discharge Diagnoses:  Principal Problem:  *Slurred speech  Active Problems:  Coronary atherosclerosis of native coronary artery  Type II or unspecified type diabetes mellitus without mention of complication, not stated as uncontrolled  H/O  Tobacco abuse  Weakness  HTN (hypertension)  Hyperlipidemia  CVA (cerebral infarction)   Discharge Condition: Stable  Diet recommendation: heart healthy, low salt diet  Filed Weights    01/25/12 2030   Weight:  85.866 kg (189 lb 4.8 oz)    History of present illness:  AAHAN Cooper is a 71 y.o. male with history of multiple strokes, coronary artery disease status post CABG March of 2013, diabetes mellitus, hypertension, ongoing tobacco abuse, hyperlipidemia, who presents to the ED with right upper extremity weakness and slurred speech x3 days. Patient states he was in the garden work and felt weak and diaphoretic and subsequently went inside check his blood glucose levels in the lower 50s. Patient states he ate a piece of candy fell asleep for about an hour. Patient states on awakening he noted his right arm weakness and some tingling in the tips of his fingers with some associated slurred speech. Patient states still has some tingling in his fingers is still with slurred speech. Patient stated that his family had been trying to convince him to come to the emergency room and he finally did today after dropping his son of for detox. Patient denies any fever, no chest pain, no shortness of breath, no nausea, no vomiting, no abdominal pain, no diarrhea, no constipation, no cough, no weakness, patient endorses bilateral lower extremity tingling and numbness in his toes. Patient was seen in the ED head  CT which was done was negative. Were called to admit the patient for further evaluation and management.  Hospital Course:  CVA  Left pontine infarct  Patient was seen by neurology, MRI brain showed left pontine infarct. At this time the symptoms have improved. Patient will get outpatient physical therapy. He will continue with aspirin for secondary stroke prevention.  Hypertension  BP is stable  Continue Lisinopril, metoprolol  Diabetes mellitus  Will continue with Novolog 70/30 at home dose. Patient's blood glucose was in 200's in the hospital, will send him on home dose, he needs to follow up with pcp to adjust the dose.  coronary artery disease status post CABG 3/ 2013  Continue aspirin, Plavix, atorvastatin, metoprolol, lisinopril.  Hyperlipidemia  Continue Atorvastatin    Procedures:  Echocardiogram  Carotid doppler  Consultations:  Neurology  Discharge Exam:  Filed Vitals:    01/26/12 2155  01/27/12 0200  01/27/12 0600  01/27/12 1012   BP:  152/64  160/62  185/76  176/62   Pulse:  57  56  54  52   Temp:  98.1 F (36.7 C)  98.2 F (36.8 C)  97.8 F (36.6 C)  98 F (36.7 C)   TempSrc:  Oral  Oral  Oral  Oral   Resp:  18  18  18  17    Height:       Weight:       SpO2:  100%  99%  100%  100%    General: In no acute distress  Cardiovascular: S1s2 rrr  Respiratory: Clear bilaterally  Neuro:Slight weakness  on right  Discharge Instructions  Discharge Orders    Future Orders  Please Complete By  Expires    Diet - low sodium heart healthy      Increase activity slowly      Discharge instructions      Comments:    Outpatient physical therapy        Medication List      As of 01/27/2012 2:00 PM     TAKE these medications        aspirin 81 MG EC tablet     Take 1 tablet (81 mg total) by mouth daily.     atorvastatin 80 MG tablet     Commonly known as: LIPITOR     Take 1 tablet (80 mg total) by mouth daily at 6 PM.     clopidogrel 75 MG tablet     Commonly  known as: PLAVIX     Take 1 tablet (75 mg total) by mouth daily with breakfast.     insulin aspart protamine-insulin aspart (70-30) 100 UNIT/ML injection     Commonly known as: NOVOLOG 70/30     Inject 30 Units into the skin 2 (two) times daily with a meal.     lisinopril 20 MG tablet     Commonly known as: PRINIVIL,ZESTRIL     Take 1 tablet (20 mg total) by mouth daily.     metFORMIN 1000 MG tablet     Commonly known as: GLUCOPHAGE     Take 0.5 tablets (500 mg total) by mouth 2 (two) times daily with a meal.     metoprolol tartrate 25 MG tablet     Commonly known as: LOPRESSOR     Take 1 tablet (25 mg total) by mouth 2 (two) times daily.     multivitamin with minerals Tabs     Take 1 tablet by mouth daily.     RABEprazole 20 MG tablet     Commonly known as: ACIPHEX     Take 20 mg by mouth daily.     thiamine 100 MG tablet     Take 1 tablet (100 mg total) by mouth daily.     VITAMIN B 12 PO     Take 1 tablet by mouth daily.     VITAMIN B-6 PO     Take 1 tablet by mouth daily.     VITAMIN C PO     Take 1 tablet by mouth daily.       The results of significant diagnostics from this hospitalization (including imaging, microbiology, ancillary and laboratory) are listed below for reference.   Significant Diagnostic Studies:  Dg Eye Foreign Body  01/25/2012 *RADIOLOGY REPORT* Clinical Data: Possible history of eye injury with metal. Pre MRI. ORBITS FOR FOREIGN BODY - 2 VIEW Comparison: CT 01/25/2012 Findings: There is no evidence for intraorbital foreign body. There is opacification of the left maxillary sinus. The patient has dental work. IMPRESSION: No evidence for retained intraorbital metallic foreign body. Original Report Authenticated By: Norva Pavlov, M.D.  Dg Chest 2 View  01/25/2012 *RADIOLOGY REPORT* Clinical Data: Recent stroke CHEST - 2 VIEW Comparison: 09/20/2011; 06/11/2011; 05/24/2011; chest CT - 05/21/2011 Findings: Grossly unchanged cardiac silhouette and mediastinal  contours with atherosclerotic calcifications within the thoracic aorta. Post median sternotomy and CABG. Bilateral pleural calcifications are grossly unchanged. No new focal airspace opacities. There is chronic mild elevation of the right hemidiaphragm. No definite pleural effusion or pneumothorax. Unchanged bones. 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 Original Report Authenticated By: Tacey Ruiz, MD  Ct Head Wo Contrast  01/25/2012 *RADIOLOGY REPORT* Clinical Data: Slurred speech. Right hand weakness. Stumbling for 3 days. CT HEAD WITHOUT CONTRAST Technique: Contiguous axial images were obtained from the base of the skull through the vertex without contrast. Comparison: 04/15/2011 Findings: The ventricles are normal in configuration. There is ventricular and sulcal enlargement reflecting mild atrophy. A well- defined focus of hypoattenuation has developed at the base of the left middle cerebral peduncle in the mid brain consistent with a lacunar infarct that is chronic but new from the prior exam. There are old cerebellar infarcts that are stable. Mild periventricular white matter hypoattenuation is also stable consistent with chronic microvascular ischemic change. There is no evidence of a recent infarct. There are no extra-axial masses or abnormal fluid collections. There is no intracranial hemorrhage. Mucous retention cyst in the left maxillary sinus. The remaining visualized sinuses and mastoid air cells are clear. No skull lesion/fracture. IMPRESSION: No acute intracranial abnormalities. Atrophy, mild chronic microvascular ischemic change and old infarcts as described. Original Report Authenticated By: Amie Portland, M.D.  Mr Brain Wo Contrast  01/26/2012 *RADIOLOGY REPORT* Clinical Data: Stroke. Diabetes and hypertension. MRI HEAD WITHOUT CONTRAST MRA HEAD WITHOUT CONTRAST Technique: Multiplanar, multiecho pulse sequences of the brain  and surrounding structures were obtained without intravenous contrast. Angiographic images of the head were obtained using MRA technique without contrast. Comparison: Head CT same day MRI HEAD Findings: There is a 1 cm acute infarction affecting the left pons extending towards the mid brain. No other acute infarction. There are old small vessel infarctions within the pons. There are numerous old cerebellar infarctions, most extensive within the left cerebellum. The cerebral hemispheres show an old cortical and subcortical infarction in the left occipital lobe and moderate chronic small vessel disease throughout the hemispheric white matter. No evidence of mass lesion, hemorrhage, hydrocephalus or extra-axial collection. No pituitary mass. The left maxillary sinus is full of fluid. IMPRESSION: 1 cm acute infarction in the left pons extending towards the mid brain. Numerous other old posterior circulation infarctions most notable on the left cerebellum and left occipital region. Chronic small vessel disease of the hemispheric deep white matter. MRA HEAD Findings: Both internal carotid arteries are patent into the brain. There is atherosclerotic irregularity in the siphon regions but no flow-limiting stenosis. The anterior and middle cerebral vessels are patent without proximal stenosis, aneurysm or vascular malformation. More distal branch vessels do show atherosclerotic irregularity. Both vertebral arteries are patent to the basilar. There is mild atherosclerotic irregularity of the distal vertebral arteries but no flow-limiting stenosis. No basilar stenosis. Both posterior inferior cerebellar arteries are patent. There is flow within both posterior cerebral arteries. The right receives its supply from the anterior circulation. There is distal vessel irregularity bilaterally. Both superior cerebellar arteries are patent but show atherosclerotic irregularity. IMPRESSION: No major vessel occlusion or correctable  proximal stenosis. Diffuse medium to small vessel atherosclerotic irregularity, most notable in the posterior circulation branches. Original Report Authenticated By: Paulina Fusi, M.D.  Mr Mra Head/brain Wo Cm  01/26/2012 *RADIOLOGY REPORT* Clinical Data: Stroke. Diabetes and hypertension. MRI HEAD WITHOUT CONTRAST MRA HEAD WITHOUT CONTRAST Technique: Multiplanar, multiecho pulse sequences of the brain and surrounding structures were obtained without intravenous contrast. Angiographic images of the head were obtained using MRA technique without contrast. Comparison: Head CT same day MRI HEAD Findings: There is a 1 cm acute infarction affecting the left pons extending towards  the mid brain. No other acute infarction. There are old small vessel infarctions within the pons. There are numerous old cerebellar infarctions, most extensive within the left cerebellum. The cerebral hemispheres show an old cortical and subcortical infarction in the left occipital lobe and moderate chronic small vessel disease throughout the hemispheric white matter. No evidence of mass lesion, hemorrhage, hydrocephalus or extra-axial collection. No pituitary mass. The left maxillary sinus is full of fluid. IMPRESSION: 1 cm acute infarction in the left pons extending towards the mid brain. Numerous other old posterior circulation infarctions most notable on the left cerebellum and left occipital region. Chronic small vessel disease of the hemispheric deep white matter. MRA HEAD Findings: Both internal carotid arteries are patent into the brain. There is atherosclerotic irregularity in the siphon regions but no flow-limiting stenosis. The anterior and middle cerebral vessels are patent without proximal stenosis, aneurysm or vascular malformation. More distal branch vessels do show atherosclerotic irregularity. Both vertebral arteries are patent to the basilar. There is mild atherosclerotic irregularity of the distal vertebral arteries but no  flow-limiting stenosis. No basilar stenosis. Both posterior inferior cerebellar arteries are patent. There is flow within both posterior cerebral arteries. The right receives its supply from the anterior circulation. There is distal vessel irregularity bilaterally. Both superior cerebellar arteries are patent but show atherosclerotic irregularity. IMPRESSION: No major vessel occlusion or correctable proximal stenosis. Diffuse medium to small vessel atherosclerotic irregularity, most notable in the posterior circulation branches. Original Report Authenticated By: Paulina Fusi, M.D.  Carotid doppler:Bilateral: 40-59% internal carotid artery stenosis, probably due to vessel tortuosity.  Microbiology:  No results found for this or any previous visit (from the past 240 hour(s)).  Labs:  Basic Metabolic Panel:   Lab  01/26/12 0620  01/25/12 2029  01/25/12 1325   NA  137  --  137   K  4.1  --  4.1   CL  100  --  100   CO2  27  --  28   GLUCOSE  211*  --  130*   BUN  13  --  17   CREATININE  0.70  0.74  0.74   CALCIUM  9.3  --  9.6   MG  --  --  --   PHOS  --  --  --    Liver Function Tests:   Lab  01/25/12 1325   AST  26   ALT  18   ALKPHOS  147*   BILITOT  0.4   PROT  7.2   ALBUMIN  3.7    No results found for this basename: LIPASE:5,AMYLASE:5 in the last 168 hours  No results found for this basename: AMMONIA:5 in the last 168 hours  CBC:   Lab  01/26/12 0620  01/25/12 2029  01/25/12 1325   WBC  6.0  7.3  6.5   NEUTROABS  --  --  4.3   HGB  13.3  13.0  13.4   HCT  39.7  39.6  40.5   MCV  83.6  84.1  84.2   PLT  211  235  250    Cardiac Enzymes:   Lab  01/27/12 1251  01/27/12 0805  01/27/12 0040  01/26/12 1820  01/26/12 1220  01/26/12 0620  01/26/12 0036  01/25/12 1903  01/25/12 1325   CKTOTAL  56  51  51  57  61  --  --  --  --   CKMB  4.3*  4.0  3.8  3.7  4.1*  --  --  --  --   CKMBINDEX  --  --  --  --  --  --  --  --  --   TROPONINI  --  --  --  --  --  <0.30  <0.30  <0.30   <0.30    BNP:  BNP (last 3 results)  No results found for this basename: PROBNP:3 in the last 8760 hours  CBG:   Lab  01/27/12 1119  01/27/12 0654  01/26/12 2105  01/26/12 1615  01/26/12 1144   GLUCAP  227*  209*  157*  277*  230*    Signed:  Liller Yohn S  [facility]  01/27/2012, 2:00 PM

## 2012-03-24 ENCOUNTER — Encounter: Payer: Self-pay | Admitting: Cardiology

## 2012-03-24 DIAGNOSIS — I10 Essential (primary) hypertension: Secondary | ICD-10-CM | POA: Insufficient documentation

## 2012-03-24 DIAGNOSIS — E119 Type 2 diabetes mellitus without complications: Secondary | ICD-10-CM | POA: Insufficient documentation

## 2012-03-24 DIAGNOSIS — I2510 Atherosclerotic heart disease of native coronary artery without angina pectoris: Secondary | ICD-10-CM | POA: Insufficient documentation

## 2012-03-26 ENCOUNTER — Ambulatory Visit: Payer: Medicare PPO | Admitting: Cardiology

## 2012-06-06 ENCOUNTER — Emergency Department (HOSPITAL_COMMUNITY)
Admission: EM | Admit: 2012-06-06 | Discharge: 2012-06-06 | Disposition: A | Discharge status: Home / Self Care | Payer: Medicare PPO | Attending: Emergency Medicine | Admitting: Emergency Medicine

## 2012-06-06 ENCOUNTER — Emergency Department (HOSPITAL_COMMUNITY): Payer: Medicare PPO

## 2012-06-06 ENCOUNTER — Encounter (HOSPITAL_COMMUNITY): Payer: Self-pay | Admitting: *Deleted

## 2012-06-06 DIAGNOSIS — Z950 Presence of cardiac pacemaker: Secondary | ICD-10-CM | POA: Insufficient documentation

## 2012-06-06 DIAGNOSIS — R339 Retention of urine, unspecified: Secondary | ICD-10-CM | POA: Insufficient documentation

## 2012-06-06 DIAGNOSIS — R358 Other polyuria: Secondary | ICD-10-CM | POA: Insufficient documentation

## 2012-06-06 DIAGNOSIS — Z8709 Personal history of other diseases of the respiratory system: Secondary | ICD-10-CM | POA: Insufficient documentation

## 2012-06-06 DIAGNOSIS — Z8679 Personal history of other diseases of the circulatory system: Secondary | ICD-10-CM | POA: Insufficient documentation

## 2012-06-06 DIAGNOSIS — Z79899 Other long term (current) drug therapy: Secondary | ICD-10-CM | POA: Insufficient documentation

## 2012-06-06 DIAGNOSIS — Z794 Long term (current) use of insulin: Secondary | ICD-10-CM | POA: Insufficient documentation

## 2012-06-06 DIAGNOSIS — K219 Gastro-esophageal reflux disease without esophagitis: Secondary | ICD-10-CM | POA: Insufficient documentation

## 2012-06-06 DIAGNOSIS — I10 Essential (primary) hypertension: Secondary | ICD-10-CM | POA: Insufficient documentation

## 2012-06-06 DIAGNOSIS — Z7982 Long term (current) use of aspirin: Secondary | ICD-10-CM | POA: Insufficient documentation

## 2012-06-06 DIAGNOSIS — R05 Cough: Secondary | ICD-10-CM | POA: Insufficient documentation

## 2012-06-06 DIAGNOSIS — F17200 Nicotine dependence, unspecified, uncomplicated: Secondary | ICD-10-CM | POA: Insufficient documentation

## 2012-06-06 DIAGNOSIS — R3589 Other polyuria: Secondary | ICD-10-CM | POA: Insufficient documentation

## 2012-06-06 DIAGNOSIS — N390 Urinary tract infection, site not specified: Secondary | ICD-10-CM | POA: Insufficient documentation

## 2012-06-06 DIAGNOSIS — Z9861 Coronary angioplasty status: Secondary | ICD-10-CM | POA: Insufficient documentation

## 2012-06-06 DIAGNOSIS — E785 Hyperlipidemia, unspecified: Secondary | ICD-10-CM | POA: Insufficient documentation

## 2012-06-06 DIAGNOSIS — E119 Type 2 diabetes mellitus without complications: Secondary | ICD-10-CM | POA: Insufficient documentation

## 2012-06-06 DIAGNOSIS — R0602 Shortness of breath: Secondary | ICD-10-CM | POA: Insufficient documentation

## 2012-06-06 DIAGNOSIS — R300 Dysuria: Secondary | ICD-10-CM | POA: Insufficient documentation

## 2012-06-06 DIAGNOSIS — Z8673 Personal history of transient ischemic attack (TIA), and cerebral infarction without residual deficits: Secondary | ICD-10-CM | POA: Insufficient documentation

## 2012-06-06 DIAGNOSIS — R059 Cough, unspecified: Secondary | ICD-10-CM | POA: Insufficient documentation

## 2012-06-06 LAB — COMPREHENSIVE METABOLIC PANEL
ALT: 19 U/L (ref 0–53)
Alkaline Phosphatase: 110 U/L (ref 39–117)
BUN: 20 mg/dL (ref 6–23)
CO2: 24 mEq/L (ref 19–32)
Calcium: 9.7 mg/dL (ref 8.4–10.5)
GFR calc Af Amer: >90 mL/min (ref >90)
GFR calc non Af Amer: 88 mL/min — ABNORMAL LOW (ref >90)
Glucose, Bld: 183 mg/dL — ABNORMAL HIGH (ref 70–99)
Potassium: 4.1 mEq/L (ref 3.5–5.1)
Sodium: 131 mEq/L — ABNORMAL LOW (ref 135–145)
Total Protein: 6.8 g/dL (ref 6.0–8.3)

## 2012-06-06 LAB — URINALYSIS, ROUTINE W REFLEX MICROSCOPIC
Bilirubin Urine: NEGATIVE
Nitrite: NEGATIVE
Protein, ur: 30 mg/dL — AB
Specific Gravity, Urine: 1.015 (ref 1.005–1.030)
Urobilinogen, UA: 4 mg/dL — ABNORMAL HIGH (ref 0.0–1.0)

## 2012-06-06 LAB — CBC WITH DIFFERENTIAL/PLATELET
Eosinophils Absolute: 0 10*3/uL (ref 0.0–0.7)
Eosinophils Relative: 0 % (ref 0–5)
Hemoglobin: 14 g/dL (ref 13.0–17.0)
Lymphocytes Relative: 9 % — ABNORMAL LOW (ref 12–46)
Lymphs Abs: 1 10*3/uL (ref 0.7–4.0)
MCH: 28.6 pg (ref 26.0–34.0)
MCV: 83 fL (ref 78.0–100.0)
Monocytes Relative: 7 % (ref 3–12)
RBC: 4.89 MIL/uL (ref 4.22–5.81)
WBC: 10.4 10*3/uL (ref 4.0–10.5)

## 2012-06-06 LAB — LIPASE, BLOOD: Lipase: 13 U/L (ref 11–59)

## 2012-06-06 LAB — URINE MICROSCOPIC-ADD ON

## 2012-06-06 MED ORDER — CEFTRIAXONE SODIUM 1 G IJ SOLR
1.0000 g | Freq: Once | INTRAMUSCULAR | Status: AC
  Administered 2012-06-06: 1 g via INTRAMUSCULAR
  Filled 2012-06-06: qty 10

## 2012-06-06 MED ORDER — CEPHALEXIN 500 MG PO CAPS: 500.0000 mg | ORAL_CAPSULE | Freq: Four times a day (QID) | ORAL | Status: DC

## 2012-06-06 NOTE — ED Notes (Signed)
Pt given dt gingerale 

## 2012-06-06 NOTE — ED Notes (Signed)
MD at bedside. 

## 2012-06-06 NOTE — ED Notes (Addendum)
Feels weak, incontinent of urine, "muscles and joints are throbbing"  Feels cold  Nausea, no vomiting, no diarrhea  .  cough

## 2012-06-06 NOTE — ED Notes (Signed)
Pt has no other needs at this time

## 2012-06-06 NOTE — ED Provider Notes (Signed)
History    This chart was scribed for Larry Lennert, MD, by Larry Cooper, ED scribe. The patient was seen in room APA14/[facility]14 and the patient's care was started at 1635.    CSN: [id number]  Arrival date & time 06/06/12  1613   First MD Initiated Contact with Patient 06/06/12 1635      Chief Complaint  Patient presents with  . Weakness    (Consider location/radiation/quality/duration/timing/severity/associated sxs/prior treatment) Patient is a 72 y.o. male presenting with weakness. The history is provided by the patient and a relative (daughter). No language interpreter was used.  Weakness This is a new problem. The current episode started more than 2 days ago. The problem occurs constantly. The problem has been gradually worsening. Associated symptoms include shortness of breath. Pertinent negatives include no chest pain, no abdominal pain and no headaches.    Larry Cooper is a 72 y.o. male who presents to the Emergency Department complaining of gradually worsening, gradual onset generalized weakness with associated urinary incontinence, mild dysuria, mild productive cough, polyuria, and SOB that began 4 days ago. He denies any sore throat, nausea, emesis, or diarrhea. He reports Dr. Clent Cooper, his PCP, earlier in the week for a regular check up before he started having any symptoms. He states that he has a h/o of similar symptoms 4-5 years ago and was diagnosed with pancreatitis.  He has a h/o of ASCVD with multiple PCIs in 2003 and 2010 including stents to the mid and proximal LAD, and CABG in 3/13.   Past Medical History  Diagnosis Date  . Diabetes mellitus, type II   . Stroke   . Arteriosclerotic cardiovascular disease (ASCVD)     Multiple PCIs, approximately 2003, 2010 Including stents to mid and proximal LAD, CX and OM 2; CABG-05/2011 x6: LIMA-distal LAD, SVG-T1, SVG-RI+distal CX, SVG-PDA + PLA   . Alcohol abuse   . Asbestosis     Pleural plaques  . Hypertension   .  Hyperlipidemia   . Tobacco abuse   . Gastroesophageal reflux disease     Past Surgical History  Procedure Laterality Date  . Heart stents    . Appendectomy    . Coronary artery bypass graft  05/22/2011    Procedure: CORONARY ARTERY BYPASS GRAFTING (CABG);  Surgeon: Larry Ovens, MD;  Location: [facility] OR;  Service: Open Heart Surgery;  Laterality: N/A;  Times 6 using endoscopically harvested right greater saphenous vein and left internal mammory artery.     History reviewed. No pertinent family history.  History  Substance Use Topics  . Smoking status: Current Every Day Smoker -- 1.00 packs/day    Types: Cigarettes    Last Attempt to Quit: 05/18/2011  . Smokeless tobacco: Never Used  . Alcohol Use: Yes     Comment: rarely      Review of Systems  Constitutional: Negative for fatigue.  HENT: Negative for congestion, sore throat, sinus pressure and ear discharge.   Eyes: Negative for discharge.  Respiratory: Positive for cough and shortness of breath.   Cardiovascular: Negative for chest pain.  Gastrointestinal: Negative for nausea, vomiting, abdominal pain and diarrhea.  Endocrine: Positive for polyuria.  Genitourinary: Positive for dysuria. Negative for frequency and hematuria.       Urine incontinence  Musculoskeletal: Negative for back pain.  Skin: Negative for rash.  Neurological: Positive for weakness. Negative for seizures and headaches.  Psychiatric/Behavioral: Negative for hallucinations.  All other systems reviewed and are negative.    Allergies  Codeine  Home Medications   Current Outpatient Rx  Name  Route  Sig  Dispense  Refill  . Ascorbic Acid (VITAMIN C PO)   Oral   Take 1 tablet by mouth daily.         Marland Kitchen aspirin 325 MG tablet   Oral   Take 325 mg by mouth daily.         Marland Kitchen atorvastatin (LIPITOR) 80 MG tablet   Oral   Take 1 tablet (80 mg total) by mouth daily at 6 PM.   30 tablet   1   . clopidogrel (PLAVIX) 75 MG tablet   Oral   Take  1 tablet (75 mg total) by mouth daily with breakfast.   30 tablet   1   . Cyanocobalamin (VITAMIN B 12 PO)   Oral   Take 1 tablet by mouth daily.         Marland Kitchen gabapentin (NEURONTIN) 300 MG capsule   Oral   Take 300 mg by mouth 3 (three) times daily.         . insulin aspart protamine-insulin aspart (NOVOLOG 70/30) (70-30) 100 UNIT/ML injection   Subcutaneous   Inject 25 Units into the skin 2 (two) times daily with a meal.         . lisinopril (PRINIVIL,ZESTRIL) 10 MG tablet   Oral   Take 10 mg by mouth daily.         . metFORMIN (GLUCOPHAGE) 1000 MG tablet   Oral   Take 1,000 mg by mouth 2 (two) times daily with a meal.         . metoprolol tartrate (LOPRESSOR) 25 MG tablet   Oral   Take 1 tablet (25 mg total) by mouth 2 (two) times daily.   60 tablet   1   . Multiple Vitamin (MULTIVITAMIN WITH MINERALS) TABS   Oral   Take 1 tablet by mouth daily.         Marland Kitchen omeprazole (PRILOSEC) 20 MG capsule   Oral   Take 20 mg by mouth daily.         . Pyridoxine HCl (VITAMIN B-6 PO)   Oral   Take 1 tablet by mouth daily.         . sildenafil (VIAGRA) 50 MG tablet   Oral   Take 50 mg by mouth daily as needed for erectile dysfunction.           BP 171/69  Pulse 80  Temp(Src) 100.2 F (37.9 C) (Oral)  Resp 20  Ht 5\' 9"  (1.753 m)  Wt 185 lb (83.915 kg)  BMI 27.31 kg/m2  SpO2 98%  Physical Exam  Nursing note and vitals reviewed. Constitutional: He is oriented to person, place, and time. He appears well-developed.  HENT:  Head: Normocephalic and atraumatic.  Eyes: Conjunctivae and EOM are normal. No scleral icterus.  Neck: Neck supple. No thyromegaly present.  Cardiovascular: Normal rate and regular rhythm.  Exam reveals no gallop and no friction rub.   No murmur heard. Pulmonary/Chest: No stridor. He has no wheezes. He has no rales. He exhibits no tenderness.  There is a well healed scar on his sternum consistent with a previous bypass surgery.   Abdominal: He exhibits no distension. There is no tenderness. There is no rebound.  Musculoskeletal: Normal range of motion. He exhibits no edema.  Lymphadenopathy:    He has no cervical adenopathy.  Neurological: He is oriented to person, place, and time. Coordination normal.  Skin: No rash  noted. No erythema.  Psychiatric: He has a normal mood and affect. His behavior is normal.    ED Course  Procedures (including critical care time)  DIAGNOSTIC STUDIES: Oxygen Saturation is 98% on room air, normal by my interpretation.    COORDINATION OF CARE:  16:40- Discussed planned course of treatment with the patient, including a chest X-ray, CBC, metabolic panel, lipase, UA, and Troponin, who is agreeable at this time.  19:30- Recheck- Discussed lab results that indicate the   Results for orders placed during the hospital encounter of 06/06/12  CBC WITH DIFFERENTIAL      Result Value Range   WBC 10.4  4.0 - 10.5 K/uL   RBC 4.89  4.22 - 5.81 MIL/uL   Hemoglobin 14.0  13.0 - 17.0 g/dL   HCT 40.9  81.1 - 91.4 %   MCV 83.0  78.0 - 100.0 fL   MCH 28.6  26.0 - 34.0 pg   MCHC 34.5  30.0 - 36.0 g/dL   RDW 78.2  95.6 - 21.3 %   Platelets 143 (*) 150 - 400 K/uL   Neutrophils Relative 83 (*) 43 - 77 %   Neutro Abs 8.7 (*) 1.7 - 7.7 K/uL   Lymphocytes Relative 9 (*) 12 - 46 %   Lymphs Abs 1.0  0.7 - 4.0 K/uL   Monocytes Relative 7  3 - 12 %   Monocytes Absolute 0.7  0.1 - 1.0 K/uL   Eosinophils Relative 0  0 - 5 %   Eosinophils Absolute 0.0  0.0 - 0.7 K/uL   Basophils Relative 0  0 - 1 %   Basophils Absolute 0.0  0.0 - 0.1 K/uL  COMPREHENSIVE METABOLIC PANEL      Result Value Range   Sodium 131 (*) 135 - 145 mEq/L   Potassium 4.1  3.5 - 5.1 mEq/L   Chloride 95 (*) 96 - 112 mEq/L   CO2 24  19 - 32 mEq/L   Glucose, Bld 183 (*) 70 - 99 mg/dL   BUN 20  6 - 23 mg/dL   Creatinine, Ser 0.86  0.50 - 1.35 mg/dL   Calcium 9.7  8.4 - 57.8 mg/dL   Total Protein 6.8  6.0 - 8.3 g/dL   Albumin  3.3 (*) 3.5 - 5.2 g/dL   AST 13  0 - 37 U/L   ALT 19  0 - 53 U/L   Alkaline Phosphatase 110  39 - 117 U/L   Total Bilirubin 0.4  0.3 - 1.2 mg/dL   GFR calc non Af Amer 88 (*) >90 mL/min   GFR calc Af Amer >90  >90 mL/min  LIPASE, BLOOD      Result Value Range   Lipase 13  11 - 59 U/L  URINALYSIS, ROUTINE W REFLEX MICROSCOPIC      Result Value Range   Color, Urine YELLOW  YELLOW   APPearance CLEAR  CLEAR   Specific Gravity, Urine 1.015  1.005 - 1.030   pH 6.5  5.0 - 8.0   Glucose, UA 250 (*) NEGATIVE mg/dL   Hgb urine dipstick MODERATE (*) NEGATIVE   Bilirubin Urine NEGATIVE  NEGATIVE   Ketones, ur NEGATIVE  NEGATIVE mg/dL   Protein, ur 30 (*) NEGATIVE mg/dL   Urobilinogen, UA 4.0 (*) 0.0 - 1.0 mg/dL   Nitrite NEGATIVE  NEGATIVE   Leukocytes, UA TRACE (*) NEGATIVE  TROPONIN I      Result Value Range   Troponin I <0.30  <0.30 ng/mL  URINE MICROSCOPIC-ADD ON      Result Value Range   WBC, UA 11-20  <3 WBC/hpf   RBC / HPF 0-2  <3 RBC/hpf   Bacteria, UA MANY (*) RARE   Labs Reviewed  CBC WITH DIFFERENTIAL  COMPREHENSIVE METABOLIC PANEL  LIPASE, BLOOD  URINALYSIS, ROUTINE W REFLEX MICROSCOPIC  TROPONIN I   Dg Chest Port 1 View  06/06/2012  *RADIOLOGY REPORT*  Clinical Data: weakness  PORTABLE CHEST - 1 VIEW  Comparison: 06/06/2012  Findings: Sternotomy wires overlie normal cardiac silhouette. There is a calcified pleural plaques in the right hemithorax along with thoracotomy defects unchanged from prior.  Pleural plaques in the left hemithorax additionally.  Chronic bronchitic change centrally.  No clear evidence of acute infiltrate.  No pneumothorax.  IMPRESSION: 1.  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 2.  No clear acute cardiopulmonary findings.  3.  Low lung volumes.   Original Report Authenticated By: Genevive Bi, M.D.    No diagnosis found.  MDM    The chart was scribed for me under my direct supervision.  I personally performed the history,  physical, and medical decision making and all procedures in the evaluation of this patient.Larry Lennert, MD 06/06/12 [phone]

## 2012-06-09 LAB — URINE CULTURE: Colony Count: >=100000

## 2012-06-10 ENCOUNTER — Telehealth (HOSPITAL_COMMUNITY): Payer: Self-pay | Admitting: Emergency Medicine

## 2012-06-10 NOTE — ED Notes (Signed)
Results received from [facility]. (+) [facility]   RX given in ED for Keflex -> sensitive to the same.  Chart appended per protocol. 

## 2012-06-29 ENCOUNTER — Encounter (HOSPITAL_COMMUNITY): Payer: Self-pay | Admitting: *Deleted

## 2012-06-29 ENCOUNTER — Emergency Department (HOSPITAL_COMMUNITY)
Admission: EM | Admit: 2012-06-29 | Discharge: 2012-06-29 | Disposition: A | Discharge status: Home / Self Care | Payer: Medicare PPO | Attending: Emergency Medicine | Admitting: Emergency Medicine

## 2012-06-29 DIAGNOSIS — Z9861 Coronary angioplasty status: Secondary | ICD-10-CM | POA: Insufficient documentation

## 2012-06-29 DIAGNOSIS — R5381 Other malaise: Secondary | ICD-10-CM | POA: Insufficient documentation

## 2012-06-29 DIAGNOSIS — R509 Fever, unspecified: Secondary | ICD-10-CM | POA: Insufficient documentation

## 2012-06-29 DIAGNOSIS — Z951 Presence of aortocoronary bypass graft: Secondary | ICD-10-CM | POA: Insufficient documentation

## 2012-06-29 DIAGNOSIS — R3915 Urgency of urination: Secondary | ICD-10-CM | POA: Insufficient documentation

## 2012-06-29 DIAGNOSIS — R5383 Other fatigue: Secondary | ICD-10-CM | POA: Insufficient documentation

## 2012-06-29 DIAGNOSIS — Z794 Long term (current) use of insulin: Secondary | ICD-10-CM | POA: Insufficient documentation

## 2012-06-29 DIAGNOSIS — Z7982 Long term (current) use of aspirin: Secondary | ICD-10-CM | POA: Insufficient documentation

## 2012-06-29 DIAGNOSIS — F17200 Nicotine dependence, unspecified, uncomplicated: Secondary | ICD-10-CM | POA: Insufficient documentation

## 2012-06-29 DIAGNOSIS — Z8709 Personal history of other diseases of the respiratory system: Secondary | ICD-10-CM | POA: Insufficient documentation

## 2012-06-29 DIAGNOSIS — Z8673 Personal history of transient ischemic attack (TIA), and cerebral infarction without residual deficits: Secondary | ICD-10-CM | POA: Insufficient documentation

## 2012-06-29 DIAGNOSIS — E119 Type 2 diabetes mellitus without complications: Secondary | ICD-10-CM | POA: Insufficient documentation

## 2012-06-29 DIAGNOSIS — N390 Urinary tract infection, site not specified: Secondary | ICD-10-CM | POA: Insufficient documentation

## 2012-06-29 DIAGNOSIS — R300 Dysuria: Secondary | ICD-10-CM | POA: Insufficient documentation

## 2012-06-29 DIAGNOSIS — Z79899 Other long term (current) drug therapy: Secondary | ICD-10-CM | POA: Insufficient documentation

## 2012-06-29 DIAGNOSIS — K219 Gastro-esophageal reflux disease without esophagitis: Secondary | ICD-10-CM | POA: Insufficient documentation

## 2012-06-29 DIAGNOSIS — I10 Essential (primary) hypertension: Secondary | ICD-10-CM | POA: Insufficient documentation

## 2012-06-29 DIAGNOSIS — Z7902 Long term (current) use of antithrombotics/antiplatelets: Secondary | ICD-10-CM | POA: Insufficient documentation

## 2012-06-29 DIAGNOSIS — I2510 Atherosclerotic heart disease of native coronary artery without angina pectoris: Secondary | ICD-10-CM | POA: Insufficient documentation

## 2012-06-29 DIAGNOSIS — E785 Hyperlipidemia, unspecified: Secondary | ICD-10-CM | POA: Insufficient documentation

## 2012-06-29 LAB — BASIC METABOLIC PANEL
BUN: 18 mg/dL (ref 6–23)
Calcium: 9.6 mg/dL (ref 8.4–10.5)
Chloride: 95 mEq/L — ABNORMAL LOW (ref 96–112)
Creatinine, Ser: 0.91 mg/dL (ref 0.50–1.35)
GFR calc Af Amer: >90 mL/min (ref >90)

## 2012-06-29 LAB — CBC
HCT: 39.5 % (ref 39.0–52.0)
MCH: 28.1 pg (ref 26.0–34.0)
MCHC: 34.2 g/dL (ref 30.0–36.0)
MCV: 82.3 fL (ref 78.0–100.0)
RDW: 14.3 % (ref 11.5–15.5)
WBC: 8.4 10*3/uL (ref 4.0–10.5)

## 2012-06-29 LAB — URINALYSIS, ROUTINE W REFLEX MICROSCOPIC
Bilirubin Urine: NEGATIVE
Glucose, UA: NEGATIVE mg/dL
Ketones, ur: NEGATIVE mg/dL
Protein, ur: 30 mg/dL — AB
Urobilinogen, UA: 1 mg/dL (ref 0.0–1.0)

## 2012-06-29 MED ORDER — CEPHALEXIN 500 MG PO CAPS: 500.0000 mg | ORAL_CAPSULE | Freq: Four times a day (QID) | ORAL | Status: AC

## 2012-06-29 MED ORDER — DEXTROSE 5 % IV SOLN
1.0000 g | Freq: Once | INTRAVENOUS | Status: AC
  Administered 2012-06-29: 1 g via INTRAVENOUS
  Filled 2012-06-29: qty 10

## 2012-06-29 MED ORDER — SODIUM CHLORIDE 0.9 % IV SOLN
1000.0000 mL | Freq: Once | INTRAVENOUS | Status: AC
  Administered 2012-06-29: 1000 mL via INTRAVENOUS

## 2012-06-29 MED ORDER — IBUPROFEN 400 MG PO TABS
600.0000 mg | ORAL_TABLET | Freq: Once | ORAL | Status: AC
  Administered 2012-06-29: 600 mg via ORAL
  Filled 2012-06-29: qty 2

## 2012-06-29 MED ORDER — SODIUM CHLORIDE 0.9 % IV SOLN: 1000.0000 mL | INTRAVENOUS | Status: DC

## 2012-06-29 NOTE — ED Provider Notes (Signed)
History     CSN: [id number]  Arrival date & time 06/29/12  2011   First MD Initiated Contact with Patient 06/29/12 2030      Chief Complaint  Patient presents with  . Weakness  . Dysuria  . Urinary Frequency   The history is provided by the patient.   the patient was seen on 06/06/2012 for urinary symptoms and was diagnosed with a urinary tract infection.  He never filled this prescription.  Urine culture demonstrated greater than 100,000 Escherichia coli that was pansensitive to antibiotics.  The patient now presents the emergency department 2-3 days of urinary frequency with urgency as well as generalized weakness and fever.  Oral temp on arrival was 100.8.  The patient denies nausea vomiting diarrhea.  He states he feels weak all over.  No lightheadedness when he stands up.  He states he did not fill the antibiotic because he thought it was going to be too expensive however he never checked into the price.  No abdominal pain.  No flank pain.  No prior history of kidney stones.  The patient is a diabetic.  Past Medical History  Diagnosis Date  . Diabetes mellitus, type II   . Stroke   . Arteriosclerotic cardiovascular disease (ASCVD)     Multiple PCIs, approximately 2003, 2010 Including stents to mid and proximal LAD, CX and OM 2; CABG-05/2011 x6: LIMA-distal LAD, SVG-T1, SVG-RI+distal CX, SVG-PDA + PLA   . Alcohol abuse   . Asbestosis     Pleural plaques  . Hypertension   . Hyperlipidemia   . Tobacco abuse   . Gastroesophageal reflux disease     Past Surgical History  Procedure Laterality Date  . Heart stents    . Appendectomy    . Coronary artery bypass graft  05/22/2011    Procedure: CORONARY ARTERY BYPASS GRAFTING (CABG);  Surgeon: Delight Ovens, MD;  Location: [facility] OR;  Service: Open Heart Surgery;  Laterality: N/A;  Times 6 using endoscopically harvested right greater saphenous vein and left internal mammory artery.     History reviewed. No pertinent family  history.  History  Substance Use Topics  . Smoking status: Current Every Day Smoker -- 1.00 packs/day    Types: Cigarettes    Last Attempt to Quit: 05/18/2011  . Smokeless tobacco: Never Used  . Alcohol Use: Yes     Comment: rarely      Review of Systems  Genitourinary: Positive for dysuria and frequency.  Neurological: Positive for weakness.  All other systems reviewed and are negative.    Allergies  Codeine  Home Medications   Current Outpatient Rx  Name  Route  Sig  Dispense  Refill  . aspirin 325 MG tablet   Oral   Take 325 mg by mouth daily.         Marland Kitchen atorvastatin (LIPITOR) 80 MG tablet   Oral   Take 1 tablet (80 mg total) by mouth daily at 6 PM.   30 tablet   1   . clopidogrel (PLAVIX) 75 MG tablet   Oral   Take 1 tablet (75 mg total) by mouth daily with breakfast.   30 tablet   1   . gabapentin (NEURONTIN) 300 MG capsule   Oral   Take 300 mg by mouth 3 (three) times daily.         . insulin aspart protamine-insulin aspart (NOVOLOG 70/30) (70-30) 100 UNIT/ML injection   Subcutaneous   Inject 25 Units into the skin 2 (  two) times daily with a meal.         . lisinopril (PRINIVIL,ZESTRIL) 10 MG tablet   Oral   Take 10 mg by mouth daily.         . metFORMIN (GLUCOPHAGE) 1000 MG tablet   Oral   Take 1,000 mg by mouth 2 (two) times daily with a meal.         . metoprolol tartrate (LOPRESSOR) 25 MG tablet   Oral   Take 1 tablet (25 mg total) by mouth 2 (two) times daily.   60 tablet   1   . omeprazole (PRILOSEC) 20 MG capsule   Oral   Take 20 mg by mouth daily.         . cephALEXin (KEFLEX) 500 MG capsule   Oral   Take 1 capsule (500 mg total) by mouth 4 (four) times daily.   40 capsule   0   . sildenafil (VIAGRA) 50 MG tablet   Oral   Take 50 mg by mouth daily as needed for erectile dysfunction.           BP 152/76  Pulse 87  Temp(Src) 100.8 F (38.2 C)  Resp 20  Ht 5\' 9"  (1.753 m)  Wt 190 lb (86.183 kg)  BMI 28.05  kg/m2  SpO2 100%  Physical Exam  Nursing note and vitals reviewed. Constitutional: He is oriented to person, place, and time. He appears well-developed and well-nourished.  HENT:  Head: Normocephalic and atraumatic.  Eyes: EOM are normal.  Neck: Normal range of motion.  Cardiovascular: Normal rate, regular rhythm, normal heart sounds and intact distal pulses.   Pulmonary/Chest: Effort normal and breath sounds normal. No respiratory distress.  Abdominal: Soft. He exhibits no distension. There is no tenderness.  Musculoskeletal: Normal range of motion.  Neurological: He is alert and oriented to person, place, and time.  Skin: Skin is warm and dry.  Psychiatric: He has a normal mood and affect. Judgment normal.    ED Course  Procedures (including critical care time)  Labs Reviewed  URINALYSIS, ROUTINE W REFLEX MICROSCOPIC - Abnormal; Notable for the following:    APPearance CLOUDY (*)    Hgb urine dipstick MODERATE (*)    Protein, ur 30 (*)    Leukocytes, UA MODERATE (*)    All other components within normal limits  BASIC METABOLIC PANEL - Abnormal; Notable for the following:    Sodium 131 (*)    Potassium 3.4 (*)    Chloride 95 (*)    Glucose, Bld 126 (*)    GFR calc non Af Amer 84 (*)    All other components within normal limits  URINE MICROSCOPIC-ADD ON - Abnormal; Notable for the following:    Bacteria, UA FEW (*)    All other components within normal limits  CULTURE, BLOOD (ROUTINE X 2)  CULTURE, BLOOD (ROUTINE X 2)  URINE CULTURE  CBC  LACTIC ACID, PLASMA   No results found.   1. Urinary tract infection   2. Fever       MDM  10:07 PM The patient feels much better after IV fluids and ibuprofen.  His heart rate his blood pressure normal.  Lactic acid is normal.  Does have urinary tract infection.  Rocephin given for this.  Urine culture pending.  He did have a fever rectally in the emergency department.  Given his age, his diabetes, his fever today the patient  is at risk for becoming more ill.  Currently he feels  much better and would like to go home I think this is very reasonable.  I've asked that the patient return to the emergency Department 36 hours for recheck.  He understands return to the ER sooner for any new or worsening symptoms.  He also has followup with his primary care physician on Thursday.  He understands that he could become more ill and understands to return at any time.  I don't think this is unreasonable as the patient is feeling much better and is lactic acid and his vital signs are otherwise normal.  Home with Keflex.  Instructed to follow his blood sugars closely.        Lyanne Co, MD 06/29/12 2209

## 2012-06-29 NOTE — ED Notes (Signed)
Pt reports diagnosis of UTI, but was never treated.  Pt reporting generalized weakness and malaise.  No nausea or vomiting reported at this time.

## 2012-06-29 NOTE — ED Notes (Signed)
Pt c/o dysuria, urinary frequency/urgency, weaness and a fever. Was seen here not to long ago for same and was unable to get his prescription filled.

## 2012-07-01 LAB — URINE CULTURE: Colony Count: >=100000

## 2012-07-02 ENCOUNTER — Telehealth (HOSPITAL_COMMUNITY): Payer: Self-pay | Admitting: Emergency Medicine

## 2012-07-04 LAB — CULTURE, BLOOD (ROUTINE X 2)

## 2012-07-22 ENCOUNTER — Encounter (HOSPITAL_COMMUNITY): Payer: Self-pay | Admitting: Emergency Medicine

## 2012-07-22 ENCOUNTER — Emergency Department (HOSPITAL_COMMUNITY)
Admission: EM | Admit: 2012-07-22 | Discharge: 2012-07-22 | Disposition: A | Discharge status: Home / Self Care | Payer: Medicare PPO | Attending: Emergency Medicine | Admitting: Emergency Medicine

## 2012-07-22 DIAGNOSIS — Z8673 Personal history of transient ischemic attack (TIA), and cerebral infarction without residual deficits: Secondary | ICD-10-CM | POA: Insufficient documentation

## 2012-07-22 DIAGNOSIS — K029 Dental caries, unspecified: Secondary | ICD-10-CM

## 2012-07-22 DIAGNOSIS — F17200 Nicotine dependence, unspecified, uncomplicated: Secondary | ICD-10-CM | POA: Insufficient documentation

## 2012-07-22 DIAGNOSIS — I10 Essential (primary) hypertension: Secondary | ICD-10-CM | POA: Insufficient documentation

## 2012-07-22 DIAGNOSIS — F1010 Alcohol abuse, uncomplicated: Secondary | ICD-10-CM | POA: Insufficient documentation

## 2012-07-22 DIAGNOSIS — Z8679 Personal history of other diseases of the circulatory system: Secondary | ICD-10-CM | POA: Insufficient documentation

## 2012-07-22 DIAGNOSIS — Z7982 Long term (current) use of aspirin: Secondary | ICD-10-CM | POA: Insufficient documentation

## 2012-07-22 DIAGNOSIS — E119 Type 2 diabetes mellitus without complications: Secondary | ICD-10-CM | POA: Insufficient documentation

## 2012-07-22 DIAGNOSIS — Z9861 Coronary angioplasty status: Secondary | ICD-10-CM | POA: Insufficient documentation

## 2012-07-22 DIAGNOSIS — K219 Gastro-esophageal reflux disease without esophagitis: Secondary | ICD-10-CM | POA: Insufficient documentation

## 2012-07-22 DIAGNOSIS — Z79899 Other long term (current) drug therapy: Secondary | ICD-10-CM | POA: Insufficient documentation

## 2012-07-22 MED ORDER — HYDROCODONE-ACETAMINOPHEN 5-325 MG PO TABS: 1.0000 | ORAL_TABLET | ORAL | Status: AC | PRN

## 2012-07-22 MED ORDER — AMOXICILLIN 500 MG PO CAPS: 500.0000 mg | ORAL_CAPSULE | Freq: Three times a day (TID) | ORAL | Status: AC

## 2012-07-22 NOTE — ED Notes (Signed)
Pt states that he has been having dental pain x 3 days, but the pain got worse yesterday.

## 2012-07-22 NOTE — ED Provider Notes (Signed)
Medical screening examination/treatment/procedure(s) were performed by non-physician practitioner and as supervising physician I was immediately available for consultation/collaboration. Devoria Albe, MD, Armando Gang   Ward Givens, MD 07/22/12 1009

## 2012-07-22 NOTE — ED Provider Notes (Signed)
History     CSN: [id number]  Arrival date & time 07/22/12  0914   First MD Initiated Contact with Patient 07/22/12 0945      Chief Complaint  Patient presents with  . Dental Pain    (Consider location/radiation/quality/duration/timing/severity/associated sxs/prior treatment) HPI Comments: Larry Cooper is a 72 y.o. Male presenting with a 3 day history of dental pain and gingival swelling.   The patient has a history of moderate dental decay but specifically is having pain and swelling along his left upper incisors.  There has been no fevers,  Chills, nausea or vomiting, also no complaint of difficulty swallowing,  Although chewing makes pain worse.  The patient has tried oragel and aspirin without relief of symptoms.         The history is provided by the patient.    Past Medical History  Diagnosis Date  . Diabetes mellitus, type II   . Stroke   . Arteriosclerotic cardiovascular disease (ASCVD)     Multiple PCIs, approximately 2003, 2010 Including stents to mid and proximal LAD, CX and OM 2; CABG-05/2011 x6: LIMA-distal LAD, SVG-T1, SVG-RI+distal CX, SVG-PDA + PLA   . Alcohol abuse   . Asbestosis     Pleural plaques  . Hypertension   . Hyperlipidemia   . Tobacco abuse   . Gastroesophageal reflux disease     Past Surgical History  Procedure Laterality Date  . Heart stents    . Appendectomy    . Coronary artery bypass graft  05/22/2011    Procedure: CORONARY ARTERY BYPASS GRAFTING (CABG);  Surgeon: Delight Ovens, MD;  Location: [facility];  Service: Open Heart Surgery;  Laterality: N/A;  Times 6 using endoscopically harvested right greater saphenous vein and left internal mammory artery.     History reviewed. No pertinent family history.  History  Substance Use Topics  . Smoking status: Current Every Day Smoker -- 1.00 packs/day    Types: Cigarettes    Last Attempt to Quit: 05/18/2011  . Smokeless tobacco: Never Used  . Alcohol Use: Yes     Comment: rarely       Review of Systems  Constitutional: Negative for fever.  HENT: Positive for dental problem. Negative for sore throat, facial swelling, neck pain and neck stiffness.   Respiratory: Negative for shortness of breath.     Allergies  Codeine  Home Medications   Current Outpatient Rx  Name  Route  Sig  Dispense  Refill  . amoxicillin (AMOXIL) 500 MG capsule   Oral   Take 1 capsule (500 mg total) by mouth 3 (three) times daily.   30 capsule   0   . aspirin 325 MG tablet   Oral   Take 325 mg by mouth daily.         Marland Kitchen atorvastatin (LIPITOR) 80 MG tablet   Oral   Take 1 tablet (80 mg total) by mouth daily at 6 PM.   30 tablet   1   . cephALEXin (KEFLEX) 500 MG capsule   Oral   Take 1 capsule (500 mg total) by mouth 4 (four) times daily.   40 capsule   0   . clopidogrel (PLAVIX) 75 MG tablet   Oral   Take 1 tablet (75 mg total) by mouth daily with breakfast.   30 tablet   1   . gabapentin (NEURONTIN) 300 MG capsule   Oral   Take 300 mg by mouth 3 (three) times daily.         Marland Kitchen  HYDROcodone-acetaminophen (NORCO/VICODIN) 5-325 MG per tablet   Oral   Take 1 tablet by mouth every 4 (four) hours as needed for pain.   15 tablet   0   . insulin aspart protamine-insulin aspart (NOVOLOG 70/30) (70-30) 100 UNIT/ML injection   Subcutaneous   Inject 25 Units into the skin 2 (two) times daily with a meal.         . lisinopril (PRINIVIL,ZESTRIL) 10 MG tablet   Oral   Take 10 mg by mouth daily.         . metFORMIN (GLUCOPHAGE) 1000 MG tablet   Oral   Take 1,000 mg by mouth 2 (two) times daily with a meal.         . metoprolol tartrate (LOPRESSOR) 25 MG tablet   Oral   Take 1 tablet (25 mg total) by mouth 2 (two) times daily.   60 tablet   1   . omeprazole (PRILOSEC) 20 MG capsule   Oral   Take 20 mg by mouth daily.         . sildenafil (VIAGRA) 50 MG tablet   Oral   Take 50 mg by mouth daily as needed for erectile dysfunction.           BP  161/87  Pulse 67  Temp(Src) 97.5 F (36.4 C) (Oral)  Ht 5\' 9"  (1.753 m)  Wt 190 lb (86.183 kg)  BMI 28.05 kg/m2  SpO2 100%  Physical Exam  Constitutional: He is oriented to person, place, and time. He appears well-developed and well-nourished. No distress.  HENT:  Head: Normocephalic and atraumatic. No trismus in the jaw.  Right Ear: Tympanic membrane and external ear normal.  Left Ear: Tympanic membrane and external ear normal.  Mouth/Throat: Oropharynx is clear and moist and mucous membranes are normal. No oral lesions. No dental abscesses.  Advanced dental decay in remaining teeth, most molars are missing, left upper lateral central and lateral incisor with deep cavities along the gingival line.  There is no gingival edema, there is mild erythema without fluctuance or drainage.  Eyes: Conjunctivae are normal.  Neck: Normal range of motion. Neck supple.  Cardiovascular: Normal rate and normal heart sounds.   Pulmonary/Chest: Effort normal.  Abdominal: He exhibits no distension.  Musculoskeletal: Normal range of motion.  Lymphadenopathy:    He has no cervical adenopathy.  Neurological: He is alert and oriented to person, place, and time.  Skin: Skin is warm and dry. No erythema.  Psychiatric: He has a normal mood and affect.    ED Course  Procedures (including critical care time)  Labs Reviewed - No data to display No results found.   1. Dental decay       MDM  Dental decay with possible early abscess.  Patient was prescribed Amoxil, hydrocodone.  Cautioned regarding sedation with this medication.  He was given referrals for followup dental care.  The patient appears reasonably screened and/or stabilized for discharge and I doubt any other medical condition or other [facility] requiring further screening, evaluation, or treatment in the ED at this time prior to discharge.         Burgess Amor, PA-C 07/22/12 1002

## 2012-08-22 ENCOUNTER — Encounter (HOSPITAL_COMMUNITY): Payer: Self-pay | Admitting: Emergency Medicine

## 2012-08-22 ENCOUNTER — Emergency Department (HOSPITAL_COMMUNITY)
Admission: EM | Admit: 2012-08-22 | Discharge: 2012-08-22 | Discharge status: Against Medical Advice | Payer: Medicare PPO | Attending: Emergency Medicine | Admitting: Emergency Medicine

## 2012-08-22 ENCOUNTER — Emergency Department (HOSPITAL_COMMUNITY): Payer: Medicare PPO

## 2012-08-22 DIAGNOSIS — I10 Essential (primary) hypertension: Secondary | ICD-10-CM | POA: Insufficient documentation

## 2012-08-22 DIAGNOSIS — R5383 Other fatigue: Secondary | ICD-10-CM | POA: Insufficient documentation

## 2012-08-22 DIAGNOSIS — Z951 Presence of aortocoronary bypass graft: Secondary | ICD-10-CM | POA: Insufficient documentation

## 2012-08-22 DIAGNOSIS — Z9861 Coronary angioplasty status: Secondary | ICD-10-CM | POA: Insufficient documentation

## 2012-08-22 DIAGNOSIS — Z8709 Personal history of other diseases of the respiratory system: Secondary | ICD-10-CM | POA: Insufficient documentation

## 2012-08-22 DIAGNOSIS — Z7902 Long term (current) use of antithrombotics/antiplatelets: Secondary | ICD-10-CM | POA: Insufficient documentation

## 2012-08-22 DIAGNOSIS — K219 Gastro-esophageal reflux disease without esophagitis: Secondary | ICD-10-CM | POA: Insufficient documentation

## 2012-08-22 DIAGNOSIS — Z794 Long term (current) use of insulin: Secondary | ICD-10-CM | POA: Insufficient documentation

## 2012-08-22 DIAGNOSIS — Z8673 Personal history of transient ischemic attack (TIA), and cerebral infarction without residual deficits: Secondary | ICD-10-CM | POA: Insufficient documentation

## 2012-08-22 DIAGNOSIS — R059 Cough, unspecified: Secondary | ICD-10-CM | POA: Insufficient documentation

## 2012-08-22 DIAGNOSIS — E119 Type 2 diabetes mellitus without complications: Secondary | ICD-10-CM | POA: Insufficient documentation

## 2012-08-22 DIAGNOSIS — Z8679 Personal history of other diseases of the circulatory system: Secondary | ICD-10-CM | POA: Insufficient documentation

## 2012-08-22 DIAGNOSIS — Z7982 Long term (current) use of aspirin: Secondary | ICD-10-CM | POA: Insufficient documentation

## 2012-08-22 DIAGNOSIS — R42 Dizziness and giddiness: Secondary | ICD-10-CM | POA: Insufficient documentation

## 2012-08-22 DIAGNOSIS — R05 Cough: Secondary | ICD-10-CM | POA: Insufficient documentation

## 2012-08-22 DIAGNOSIS — E785 Hyperlipidemia, unspecified: Secondary | ICD-10-CM | POA: Insufficient documentation

## 2012-08-22 DIAGNOSIS — R5381 Other malaise: Secondary | ICD-10-CM | POA: Insufficient documentation

## 2012-08-22 DIAGNOSIS — R110 Nausea: Secondary | ICD-10-CM | POA: Insufficient documentation

## 2012-08-22 DIAGNOSIS — Z79899 Other long term (current) drug therapy: Secondary | ICD-10-CM | POA: Insufficient documentation

## 2012-08-22 DIAGNOSIS — R079 Chest pain, unspecified: Secondary | ICD-10-CM | POA: Insufficient documentation

## 2012-08-22 DIAGNOSIS — R062 Wheezing: Secondary | ICD-10-CM | POA: Insufficient documentation

## 2012-08-22 DIAGNOSIS — F17200 Nicotine dependence, unspecified, uncomplicated: Secondary | ICD-10-CM | POA: Insufficient documentation

## 2012-08-22 LAB — COMPREHENSIVE METABOLIC PANEL
Albumin: 3.6 g/dL (ref 3.5–5.2)
BUN: 7 mg/dL (ref 6–23)
Chloride: 92 mEq/L — ABNORMAL LOW (ref 96–112)
Creatinine, Ser: 0.62 mg/dL (ref 0.50–1.35)
GFR calc Af Amer: >90 mL/min (ref >90)
Total Bilirubin: 0.3 mg/dL (ref 0.3–1.2)
Total Protein: 6.8 g/dL (ref 6.0–8.3)

## 2012-08-22 LAB — CBC WITH DIFFERENTIAL/PLATELET
Basophils Absolute: 0 10*3/uL (ref 0.0–0.1)
Basophils Relative: 0 % (ref 0–1)
Eosinophils Relative: 1 % (ref 0–5)
HCT: 44.8 % (ref 39.0–52.0)
MCHC: 34.4 g/dL (ref 30.0–36.0)
MCV: 81.9 fL (ref 78.0–100.0)
Monocytes Absolute: 0.4 10*3/uL (ref 0.1–1.0)
RDW: 15.9 % — ABNORMAL HIGH (ref 11.5–15.5)

## 2012-08-22 MED ORDER — ALBUTEROL SULFATE (5 MG/ML) 0.5% IN NEBU: 5.0000 mg | INHALATION_SOLUTION | Freq: Once | RESPIRATORY_TRACT | Status: DC

## 2012-08-22 MED ORDER — IPRATROPIUM BROMIDE 0.02 % IN SOLN: 0.5000 mg | Freq: Once | RESPIRATORY_TRACT | Status: DC

## 2012-08-22 NOTE — Progress Notes (Signed)
Respiratory Therapist did not receive any notification of an order for a nebulizer treatment for this patient.

## 2012-08-22 NOTE — ED Notes (Signed)
Teresa Pelton RN made aware of patient leaving AMA and [facility] Department checking on patient. Once call received by RSD on patient's status patient will be discharge from tracker board.

## 2012-08-22 NOTE — ED Notes (Signed)
Resp paged for breathing treatment.  

## 2012-08-22 NOTE — ED Notes (Signed)
Went to room to round on patient. Patient not in room. Gown on bed with leads and blood pressure cuff. Patient had IV in. No Iv tubing noted in room. Charge nurse made aware. Charge nurse going outside to see patient is smoking.

## 2012-08-22 NOTE — ED Notes (Signed)
Dr Estell Harpin aware of patient leaving and not notifying staff.

## 2012-08-22 NOTE — ED Notes (Signed)
Patient brought in via EMS. Alert and oriented. Airway patent. Patient c/o non-radiating mid-sternal chest pain x3 days. Patient reports shortness of breath, lightheadedness, nausea, and weakness. Patient has extensive cardiac hx. Patient received 4 baby aspirin and 1 SL nitro via EMS. Patient reports some relief. Per EMS blood sugar 102.

## 2012-08-22 NOTE — ED Notes (Signed)
[facility] department called to check on patient. RSD to call back and notify staff about patient and IV.

## 2012-08-22 NOTE — ED Provider Notes (Signed)
History    This chart was scribed for non-physician practitioner working with Larry Lennert, MD by Toya Smothers, ED Scribe. This patient was seen in room APA05/APA05 and the patient's care was started at 3:57 PM.  CSN: [id number]  Arrival date & time 08/22/12  1514   First MD Initiated Contact with Patient 08/22/12 1524      Chief Complaint  Patient presents with  . Chest Pain    Patient is a 72 y.o. male presenting with chest pain and cough. The history is provided by the patient. No language interpreter was used.  Chest Pain Pain location:  Unable to specify Pain radiates to:  Does not radiate Pain severity:  No pain Onset quality:  Unable to specify Associated symptoms: no abdominal pain, no back pain, no cough, no fatigue and no headache   Cough Cough characteristics:  Productive Sputum characteristics:  Wallace Cullens Severity:  Moderate Timing:  Sporadic Associated symptoms: chest pain   Associated symptoms: no eye discharge, no headaches and no rash     HPI Comments: Larry Cooper is a 72 y.o. male with extensive cardiac hx, who brought in by ambulance to Emergency Department complaining of 2 weeks of prodctive cough with 3 days of intermittent chest pain. Cough is productive of white sputum and has worsened over the past 3 days. Pain is dull, non-radiating, occurs without provocation, and is alleviated by nothing. Pt endorses associated light-headedness, nausea, and weakness. Pt denies diaphoresis, fever, chills, nausea, vomiting, diarrhea, weakness, cough, SOB and any other pain. Current medications include Lipitor 80 mg x1 daily, Keflex 500 mg 1x daily, Neurontin 300 mg 3x daily, and Lisinopril 10 mg 1x daily. Surgical hx includes heart stent placement and coronary artery bypass graft. Pt is a current everyday smoker, admitting alcohol use (2 qurtz of beer/ day) and illicit drug use.  PCP: Dr. Cliffton Asters    Past Medical History  Diagnosis Date  . Diabetes mellitus, type II   .  Stroke   . Arteriosclerotic cardiovascular disease (ASCVD)     Multiple PCIs, approximately 2003, 2010 Including stents to mid and proximal LAD, CX and OM 2; CABG-05/2011 x6: LIMA-distal LAD, SVG-T1, SVG-RI+distal CX, SVG-PDA + PLA   . Alcohol abuse   . Asbestosis(501)     Pleural plaques  . Hypertension   . Hyperlipidemia   . Tobacco abuse   . Gastroesophageal reflux disease     Past Surgical History  Procedure Laterality Date  . Heart stents    . Appendectomy    . Coronary artery bypass graft  05/22/2011    Procedure: CORONARY ARTERY BYPASS GRAFTING (CABG);  Surgeon: Delight Ovens, MD;  Location: [facility] OR;  Service: Open Heart Surgery;  Laterality: N/A;  Times 6 using endoscopically harvested right greater saphenous vein and left internal mammory artery.     Family History  Problem Relation Age of Onset  . Heart failure Other   . Diabetes Other   . Coronary artery disease Other     History  Substance Use Topics  . Smoking status: Current Every Day Smoker -- 1.00 packs/day for 54 years    Types: Cigarettes    Last Attempt to Quit: 05/18/2011  . Smokeless tobacco: Never Used  . Alcohol Use: Yes     Comment: 2 quarts of beer a day      Review of Systems  Constitutional: Negative for appetite change and fatigue.  HENT: Negative for congestion, sinus pressure and ear discharge.   Eyes: Negative  for discharge.  Respiratory: Negative for cough.   Cardiovascular: Positive for chest pain.  Gastrointestinal: Negative for abdominal pain and diarrhea.  Genitourinary: Negative for frequency and hematuria.  Musculoskeletal: Negative for back pain.  Skin: Negative for rash.  Neurological: Negative for seizures and headaches.  Psychiatric/Behavioral: Negative for hallucinations.    Allergies  Codeine  Home Medications   Current Outpatient Rx  Name  Route  Sig  Dispense  Refill  . aspirin 325 MG tablet   Oral   Take 325 mg by mouth daily.         Marland Kitchen atorvastatin  (LIPITOR) 80 MG tablet   Oral   Take 1 tablet (80 mg total) by mouth daily at 6 PM.   30 tablet   1   . cephALEXin (KEFLEX) 500 MG capsule   Oral   Take 1 capsule (500 mg total) by mouth 4 (four) times daily.   40 capsule   0   . clopidogrel (PLAVIX) 75 MG tablet   Oral   Take 1 tablet (75 mg total) by mouth daily with breakfast.   30 tablet   1   . gabapentin (NEURONTIN) 300 MG capsule   Oral   Take 300 mg by mouth 3 (three) times daily.         Marland Kitchen HYDROcodone-acetaminophen (NORCO/VICODIN) 5-325 MG per tablet   Oral   Take 1 tablet by mouth every 4 (four) hours as needed for pain.   15 tablet   0   . insulin aspart protamine-insulin aspart (NOVOLOG 70/30) (70-30) 100 UNIT/ML injection   Subcutaneous   Inject 25 Units into the skin 2 (two) times daily with a meal.         . lisinopril (PRINIVIL,ZESTRIL) 10 MG tablet   Oral   Take 10 mg by mouth daily.         . metFORMIN (GLUCOPHAGE) 1000 MG tablet   Oral   Take 1,000 mg by mouth 2 (two) times daily with a meal.         . metoprolol tartrate (LOPRESSOR) 25 MG tablet   Oral   Take 1 tablet (25 mg total) by mouth 2 (two) times daily.   60 tablet   1   . omeprazole (PRILOSEC) 20 MG capsule   Oral   Take 20 mg by mouth daily.         . sildenafil (VIAGRA) 50 MG tablet   Oral   Take 50 mg by mouth daily as needed for erectile dysfunction.           BP 139/75  Pulse 90  Temp(Src) 98.5 F (36.9 C) (Oral)  Resp 22  Ht 5\' 10"  (1.778 m)  Wt 185 lb (83.915 kg)  BMI 26.54 kg/m2  SpO2 96%  Physical Exam  Constitutional: He is oriented to person, place, and time. He appears well-developed.  HENT:  Head: Normocephalic.  Eyes: Conjunctivae and EOM are normal. No scleral icterus.  Neck: Neck supple. No thyromegaly present.  Cardiovascular: Normal rate and regular rhythm.  Exam reveals no gallop and no friction rub.   No murmur heard. Pulmonary/Chest: No stridor. He has no wheezes. He has no rales.  He exhibits no tenderness.  Minimal wheezing bilaterally  Abdominal: He exhibits no distension. There is no tenderness. There is no rebound.  Musculoskeletal: Normal range of motion. He exhibits no edema.  Lymphadenopathy:    He has no cervical adenopathy.  Neurological: He is oriented to person, place, and time. Coordination  normal.  Skin: No rash noted. No erythema.  Psychiatric: He has a normal mood and affect. His behavior is normal.    ED Course  Procedures DIAGNOSTIC STUDIES: Oxygen Saturation is 96% on room air, nromal by my interpretation.    COORDINATION OF CARE: 15:29- Evaluated Pt. Pt is awake, alert, and without distress. 15:32- Patient understand and agree with initial ED impression and plan with expectations set for ED visit.     Labs Reviewed  CBC WITH DIFFERENTIAL  TROPONIN I  COMPREHENSIVE METABOLIC PANEL   No results found.   No diagnosis found.   Date: 08/22/2012  Rate:89  Rhythm: normal sinus rhythm  QRS Axis: normal  Intervals: normal  ST/T Wave abnormalities: normal  Conduction Disutrbances:none  Narrative Interpretation:   Old EKG Reviewed: none available    MDM  Pt left ama  The chart was scribed for me under my direct supervision.  I personally performed the history, physical, and medical decision making and all procedures in the evaluation of this patient.Larry Lennert, MD 08/22/12 [phone]

## 2013-03-12 IMAGING — CT CT ABD-PELV W/ CM
2 of 4 series · 16 of 46 positions shown, 18 images · IV contrast (Omnipaque 300)
Comparison: None.

CLINICAL DATA: Fall.  Alcohol intoxication.

CT ABDOMEN AND PELVIS WITH CONTRAST
TECHNIQUE: Multidetector CT imaging of the abdomen and pelvis was
performed following the standard protocol during bolus
administration of intravenous contrast.
Contrast:
100 ml of 5mnipaque-BSS

[Series 2: abd_pel_with 5.0 b40f · axial · 0.74mm/px · z∈[-544,-104]mm · 13 of 98 slices shown, 15 images]
[im 5/98  soft-tissue]
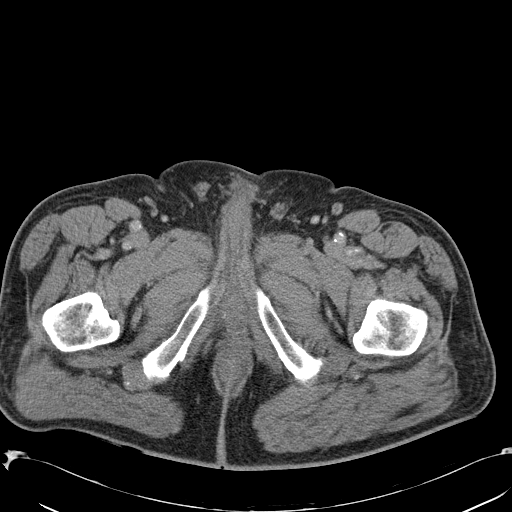
[im 5/98  bone]
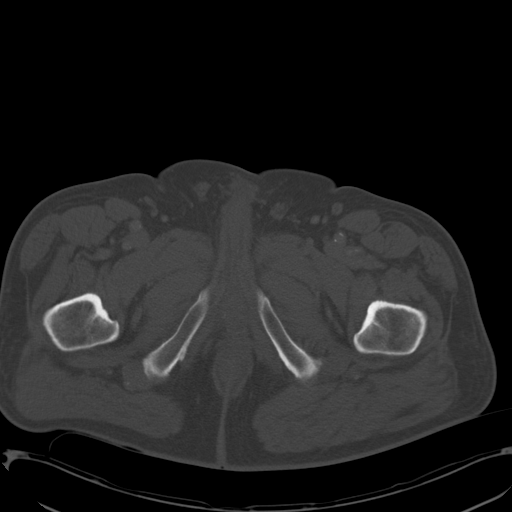
[im 14/98  soft-tissue]
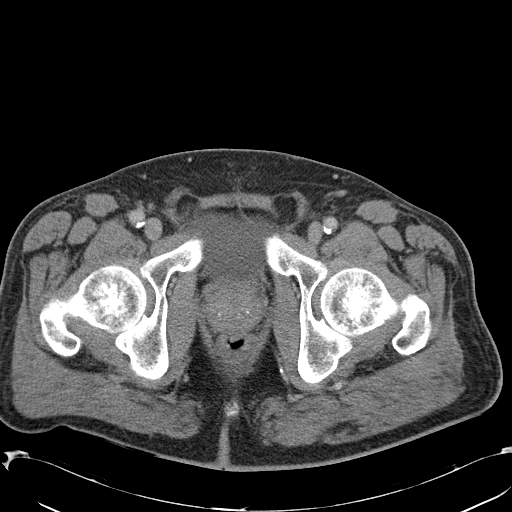
[im 19/98  soft-tissue]
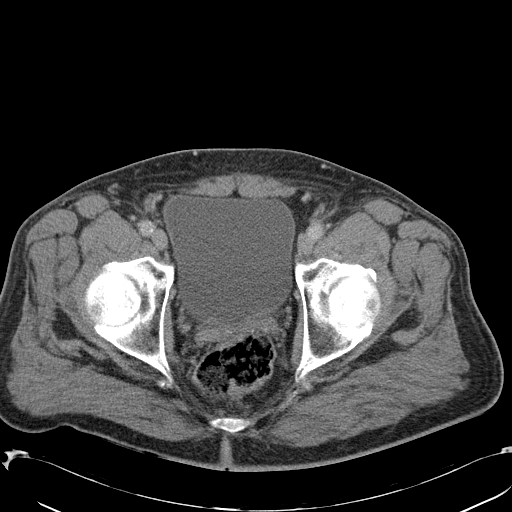
[im 28/98  soft-tissue]
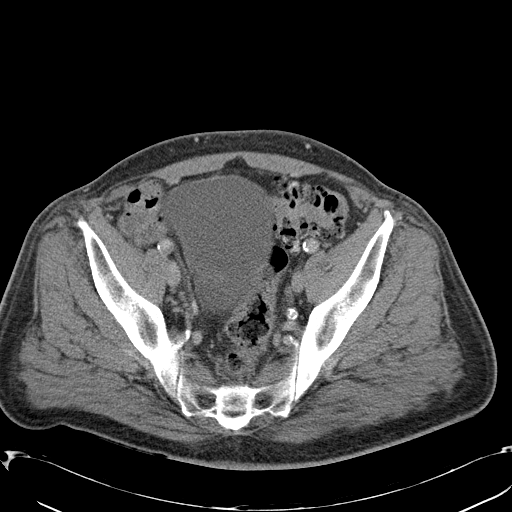
[im 33/98  soft-tissue]
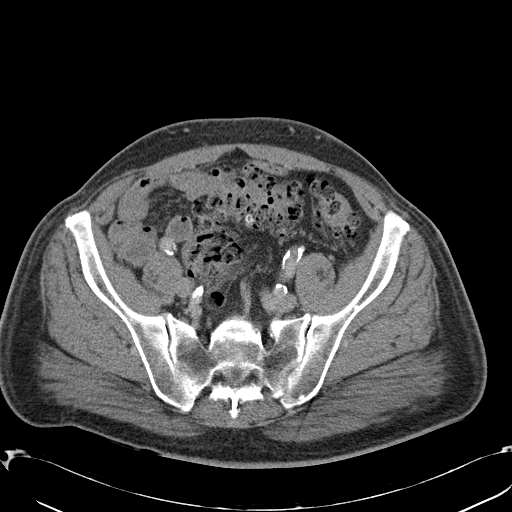
[im 42/98  soft-tissue]
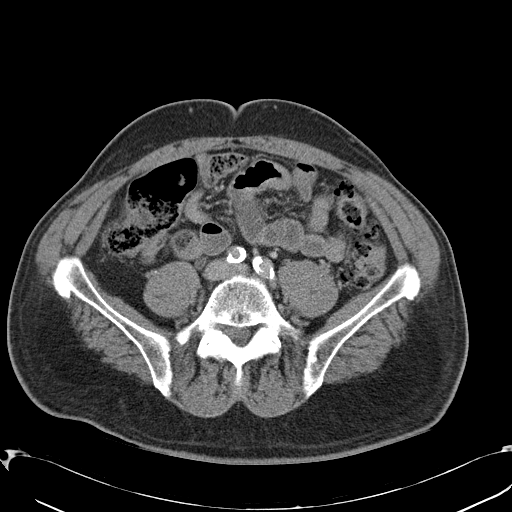
[im 51/98  soft-tissue]
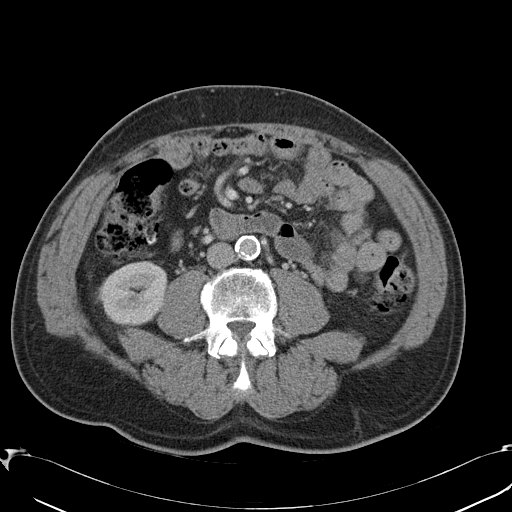
[im 56/98  soft-tissue]
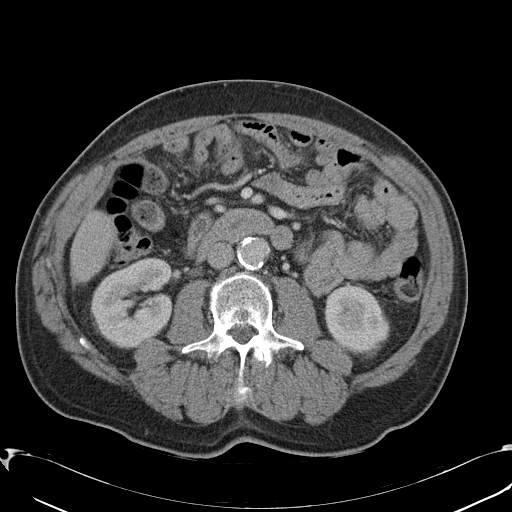
[im 65/98  soft-tissue]
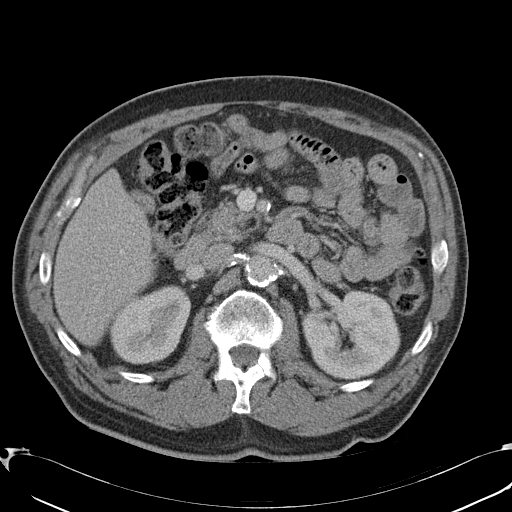
[im 65/98  bone]
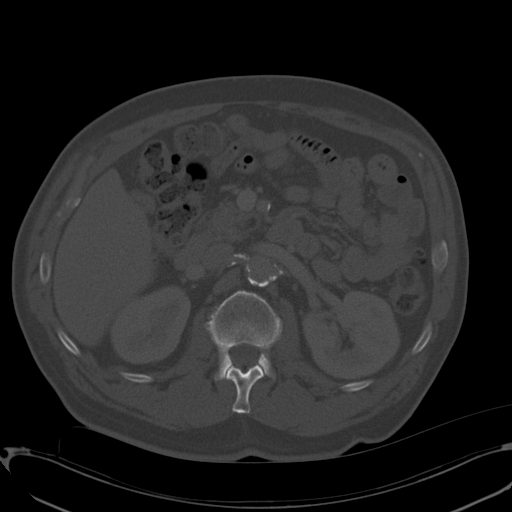
[im 70/98  soft-tissue]
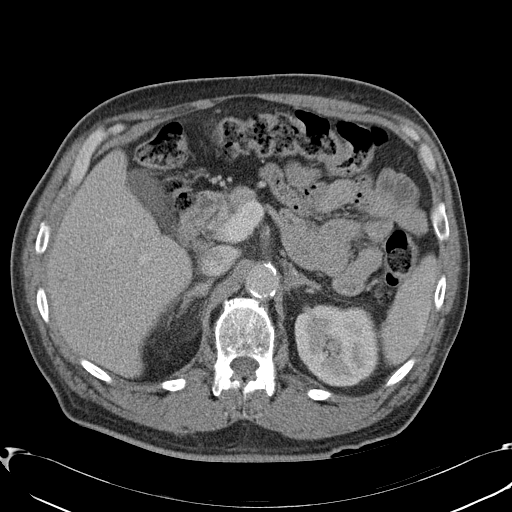
[im 79/98  soft-tissue]
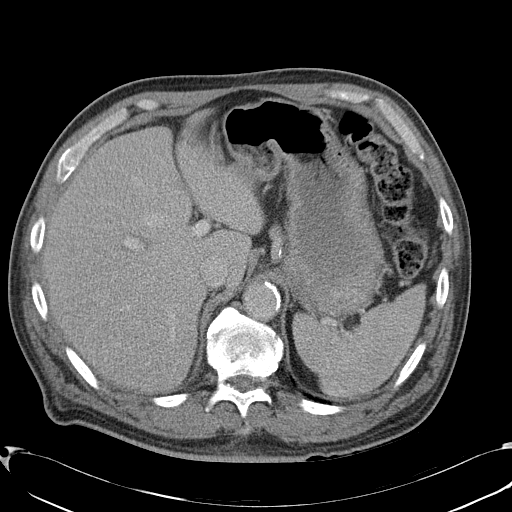
[im 84/98  soft-tissue]
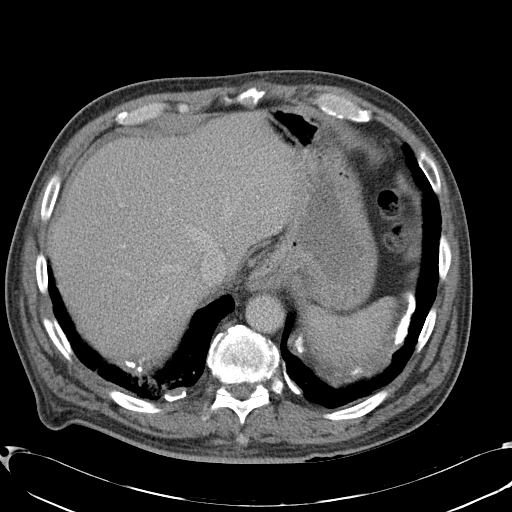
[im 93/98  soft-tissue]
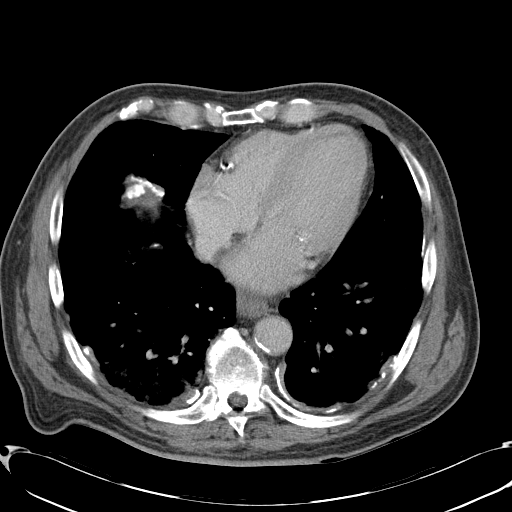

[Series 4: abd_pel_with 3.0 spo cor · coronal · 0.75mm/px · 3 of 92 slices shown]
[im 31/92  soft-tissue]
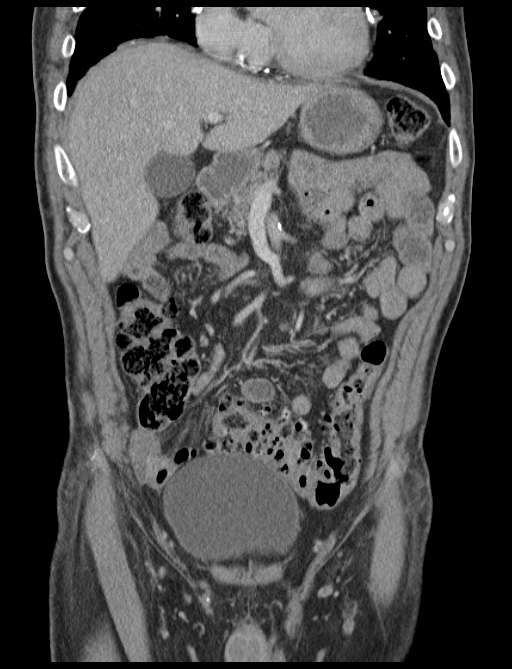
[im 41/92  soft-tissue]
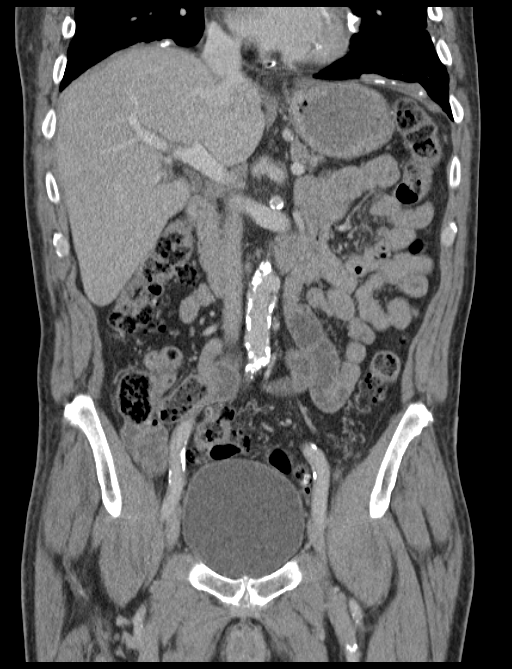
[im 51/92  soft-tissue]
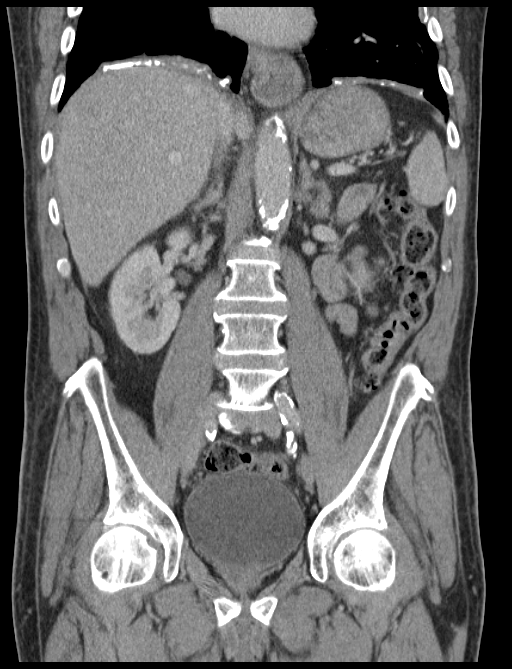

[16 of 46 positions shown; findings below may reference images not displayed]

FINDINGS: Bilateral pleural calcifications are identified
consistent with prior asbestos exposure.  Dependent type changes
are noted within both lung bases.

Calcified atherosclerotic disease affects the coronary arteries.

No focal liver abnormality.  The gallbladder is normal.

No biliary dilatation.  The pancreas appears normal.

The spleen is negative.

Right adrenal gland adenoma is present.  The left adrenal gland is
negative.

Normal appearance of the right kidney.  Left kidney normal.

No enlarged upper abdominal adenopathy.  Advanced, calcified
atherosclerotic disease affects the abdominal aorta and its
branches.

There is no pelvic or inguinal adenopathy identified.  The urinary
bladder appears normal.

The patient has a small hiatal hernia.

Small bowel loops have a normal course and caliber without
obstruction.  Diffuse colonic diverticulosis noted without acute
inflammation.

No free fluid or abnormal fluid collections within the abdomen or
the pelvis.

Review of the visualized osseous structures is significant for
lumbar degenerative disc disease. The 7th through 10th right rib
fractures are noted.
IMPRESSION: 1.  No acute findings within the abdomen or pelvis.
2.  Calcified pleural plaques consistent with prior asbestos
exposure.
3.  Coronary artery calcifications consistent with atherosclerotic
disease.
4.  Acute right posterior rib fractures

## 2013-03-12 IMAGING — CT CT HEAD W/O CM
4 of 5 series · 15 of 47 positions shown, 16 images · non-contrast
Comparison: None

CT HEAD

CLINICAL DATA: Alcohol intoxication and fall.

CT HEAD WITHOUT CONTRAST
CT CERVICAL SPINE WITHOUT CONTRAST
TECHNIQUE: Multidetector CT imaging of the head and cervical spine
was performed following the standard protocol without intravenous
contrast.  Multiplanar CT image reconstructions of the cervical
spine were also generated.

[Series 2: headseq 4.8 h37s · axial · 0.49mm/px · z∈[+256,+344]mm · 3 of 36 slices shown, 4 images]
[im 9/36  brain]
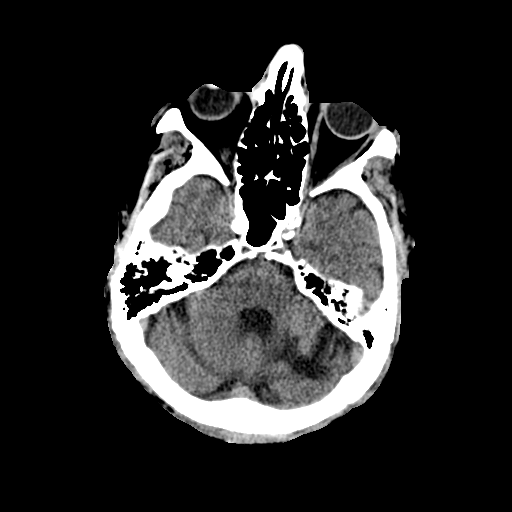
[im 9/36  bone]
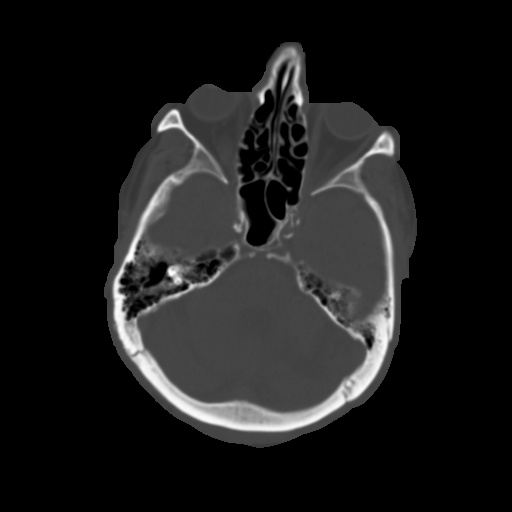
[im 18/36  brain]
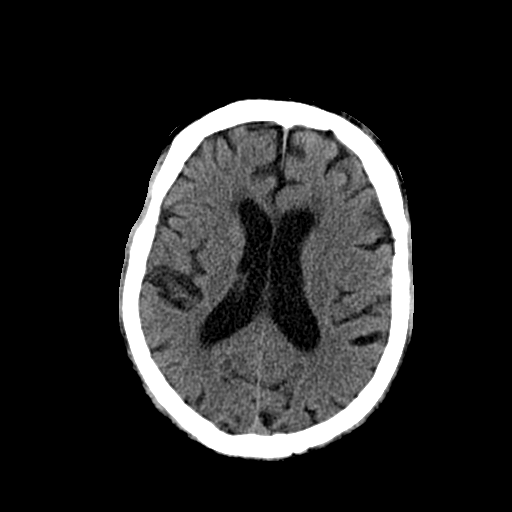
[im 27/36  brain]
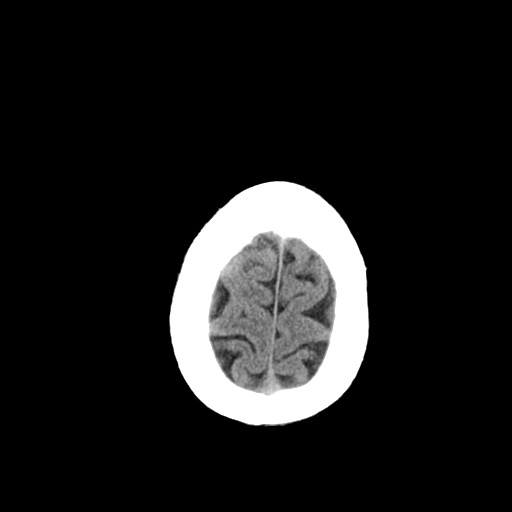

[Series 7: sagittal bone 2.0 · sagittal · 0.29mm/px · 3 of 54 slices shown]
[im 18/54  brain]
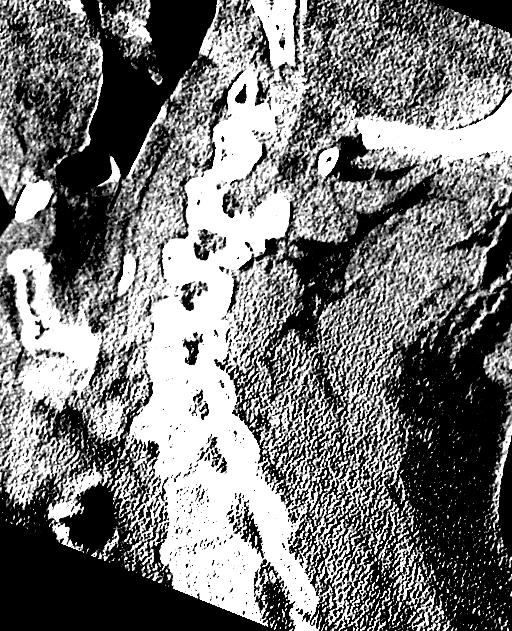
[im 27/54  brain]
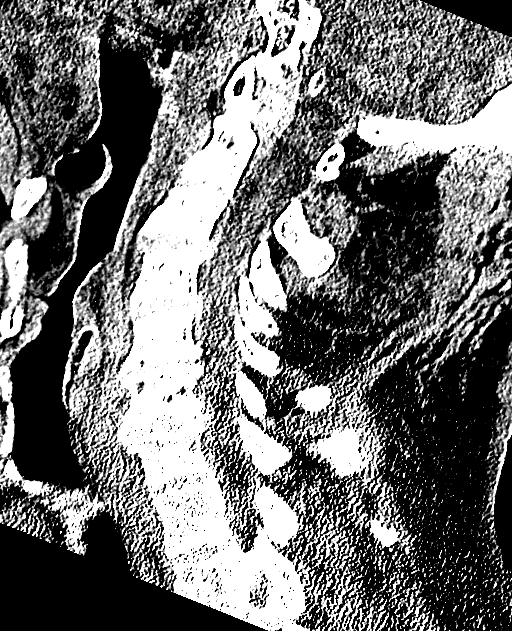
[im 36/54  brain]
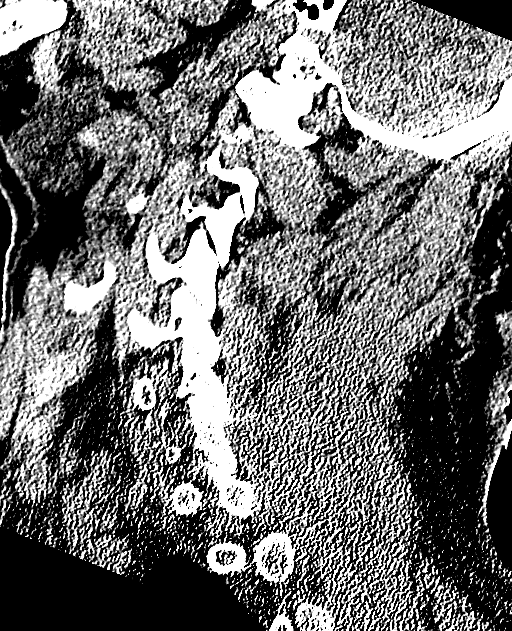

[Series 8: coronal bone 2.0 · coronal · 0.27mm/px · 3 of 65 slices shown]
[im 22/65  brain]
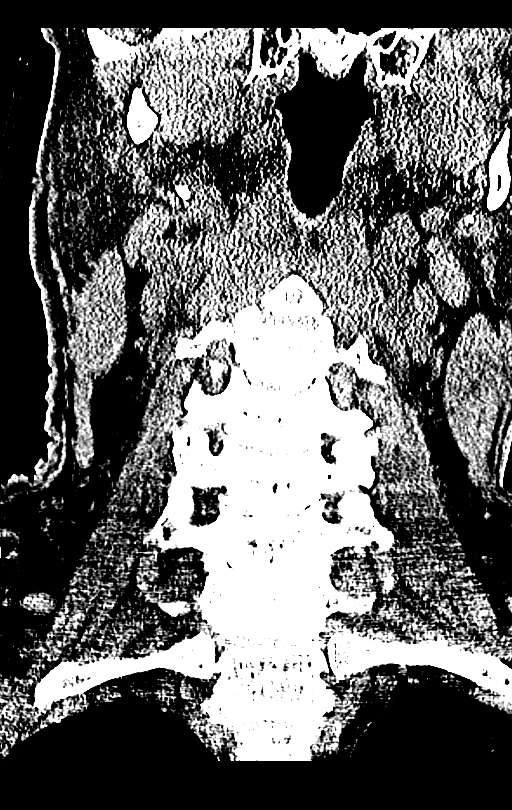
[im 29/65  brain]
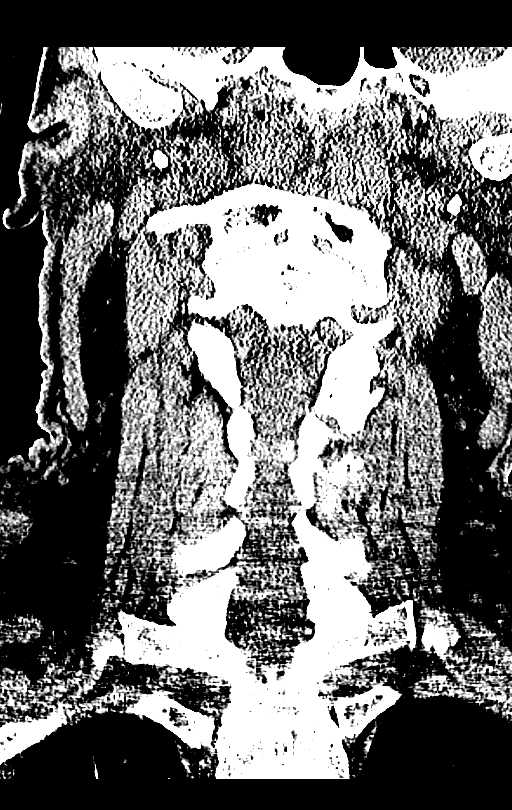
[im 36/65  brain]
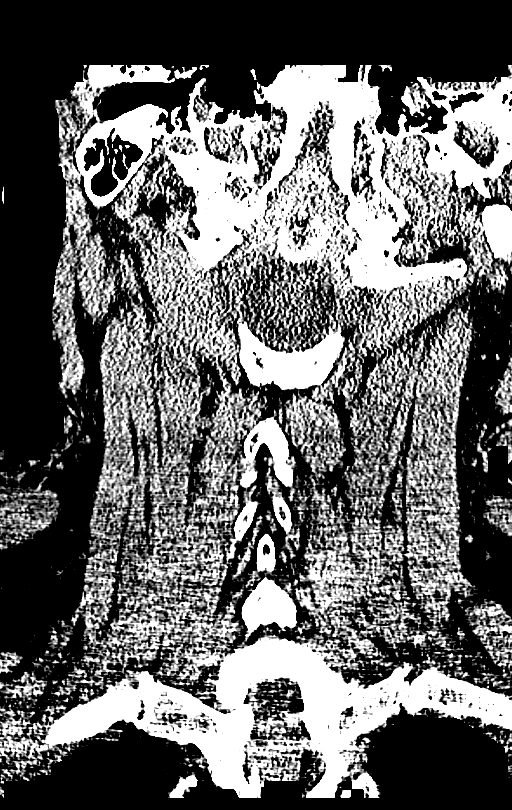

[Series 9: axial bone 2.0 · axial · 0.20mm/px · z∈[+22,+129]mm · 6 of 99 slices shown]
[im 9/99  bone]
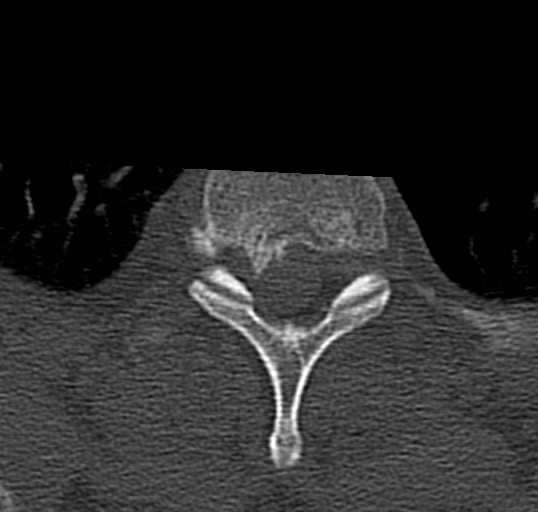
[im 25/99  bone]
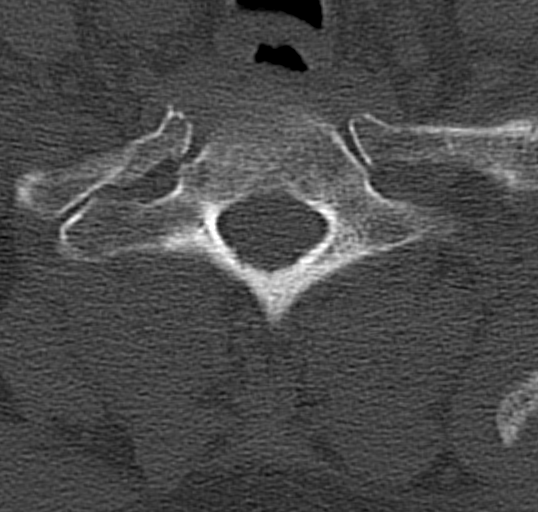
[im 33/99  bone]
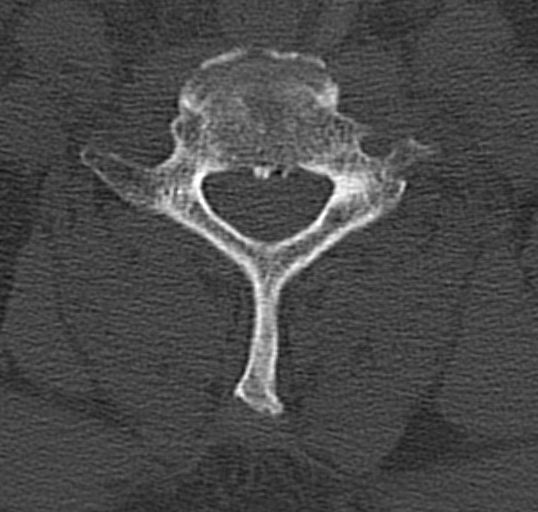
[im 41/99  bone]
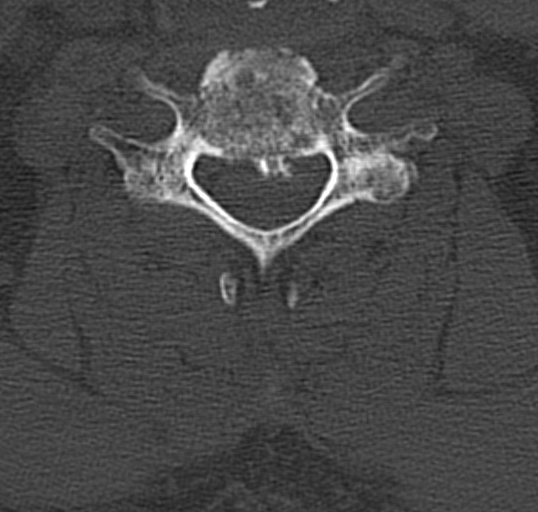
[im 58/99  bone]
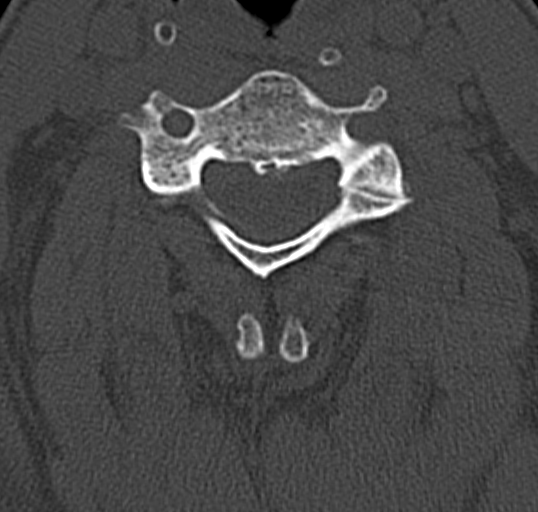
[im 66/99  bone]
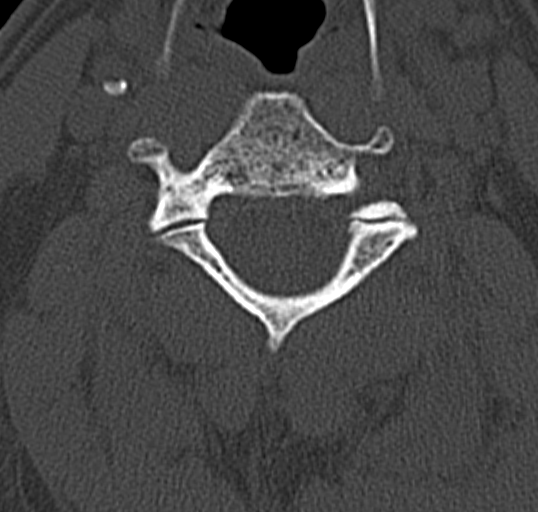

[15 of 47 positions shown; findings below may reference images not displayed]

FINDINGS: There is diffuse patchy low density throughout the
subcortical and periventricular white matter consistent with
chronic small vessel ischemic change.

There is prominence of the sulci and ventricles consistent with
brain atrophy.

Old left cerebellar hemisphere infarct is noted.

Small lacunar infarct is noted within the right cerebellar
hemisphere.

The mastoid air cells are clear.

Retention cyst versus polyp is noted within the left maxillary
sinus.

The skull appears intact.
IMPRESSION: 1.  Small vessel ischemic disease and brain atrophy.
2.  No acute intracranial abnormalities.

CT CERVICAL SPINE
FINDINGS: Normal alignment of the cervical spine.

The vertebral body heights are well preserved.

Disc space narrowing and ventral endplate spurring is noted at C5-6
and C6-7.  Facet joints are aligned.

The prevertebral soft tissue space appears normal.

Calcification involving the right vertebral artery is identified.

There are no fractures noted.

The lung apices appear clear.
IMPRESSION: 1.  No acute findings.
2.  Cervical spondylosis
3.  Right vertebral artery calcified atherosclerotic disease.

## 2014-02-25 ENCOUNTER — Encounter (HOSPITAL_COMMUNITY): Payer: Self-pay | Admitting: Cardiology

## 2014-12-24 ENCOUNTER — Telehealth: Payer: Self-pay | Admitting: Adult Health

## 2014-12-24 NOTE — Telephone Encounter (Deleted)
Per TP on 10.7.16 Set pt up with Bipap auto titration x 2 weeks  Download in 2 weeks   Attempted to call pt but was unable to LVM.

## 2014-12-24 NOTE — Telephone Encounter (Addendum)
Opened in error

## 2016-04-19 DEATH — deceased
